# Patient Record
Sex: Female | Born: 1965 | Marital: Married | Smoking: Never smoker
Health system: Southern US, Community
[De-identification: ages and names within clinical notes are randomized; demographics above are authoritative.]

## PROBLEM LIST (undated history)

## (undated) DIAGNOSIS — E66811 Obesity, class 1: Secondary | ICD-10-CM

## (undated) DIAGNOSIS — E669 Obesity, unspecified: Secondary | ICD-10-CM

## (undated) DIAGNOSIS — F4321 Adjustment disorder with depressed mood: Secondary | ICD-10-CM

## (undated) DIAGNOSIS — Z9289 Personal history of other medical treatment: Secondary | ICD-10-CM

## (undated) HISTORY — DX: Obesity, unspecified: E66.9

## (undated) HISTORY — DX: Obesity, class 1: E66.811

## (undated) HISTORY — DX: Personal history of other medical treatment: Z92.89

## (undated) HISTORY — DX: Adjustment disorder with depressed mood: F43.21

---
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
## 2013-01-28 DIAGNOSIS — F4321 Adjustment disorder with depressed mood: Secondary | ICD-10-CM

## 2013-01-28 HISTORY — DX: Adjustment disorder with depressed mood: F43.21

## 2015-11-06 DIAGNOSIS — Z9289 Personal history of other medical treatment: Secondary | ICD-10-CM

## 2015-11-06 HISTORY — DX: Personal history of other medical treatment: Z92.89

## 2016-11-04 ENCOUNTER — Telehealth: Payer: Self-pay | Admitting: Certified Nurse Midwife

## 2016-11-04 MED ORDER — LEVONORGESTREL-ETHINYL ESTRAD 0.15-30 MG-MCG PO TABS: 1.0000 | ORAL_TABLET | Freq: Every day | ORAL | 0 refills | Status: DC

## 2016-11-04 NOTE — Telephone Encounter (Signed)
Left msg for pt that one month of rx sent in.

## 2016-11-04 NOTE — Telephone Encounter (Signed)
Patient needs refill on bc, annual scheduled 11/7 with [facility] but will run out before.  Preferred pharmacy [facility] [place].

## 2016-11-27 ENCOUNTER — Telehealth: Payer: Self-pay

## 2016-11-27 MED ORDER — LEVONORGESTREL-ETHINYL ESTRAD 0.15-30 MG-MCG PO TABS: 1.0000 | ORAL_TABLET | Freq: Every day | ORAL | 0 refills | Status: DC

## 2016-11-27 NOTE — Telephone Encounter (Signed)
Pt called for refill of bc [facility] [place] to get her to appt on 11/29th.  Pt aware via vm.

## 2016-12-04 ENCOUNTER — Ambulatory Visit: Payer: Self-pay | Admitting: Certified Nurse Midwife

## 2016-12-24 ENCOUNTER — Other Ambulatory Visit: Payer: Self-pay

## 2016-12-24 DIAGNOSIS — Z3041 Encounter for surveillance of contraceptive pills: Secondary | ICD-10-CM

## 2016-12-24 MED ORDER — LEVONORGESTREL-ETHINYL ESTRAD 0.15-30 MG-MCG PO TABS: 1.0000 | ORAL_TABLET | Freq: Every day | ORAL | 0 refills | Status: DC

## 2016-12-26 ENCOUNTER — Ambulatory Visit: Payer: Self-pay | Admitting: Certified Nurse Midwife

## 2017-01-16 ENCOUNTER — Ambulatory Visit: Payer: Self-pay | Admitting: Certified Nurse Midwife

## 2017-01-16 ENCOUNTER — Other Ambulatory Visit: Payer: Self-pay

## 2017-01-16 DIAGNOSIS — Z3041 Encounter for surveillance of contraceptive pills: Secondary | ICD-10-CM

## 2017-01-16 MED ORDER — LEVONORGESTREL-ETHINYL ESTRAD 0.15-30 MG-MCG PO TABS: 1.0000 | ORAL_TABLET | Freq: Every day | ORAL | 0 refills | Status: DC

## 2017-01-16 NOTE — Telephone Encounter (Signed)
Pt had to resch annual to 1/21, need [facility]ia ref[facility] til then[facility] [place].  [phone]  Left detailed msg the refill eRx'd.

## 2017-02-17 ENCOUNTER — Encounter: Payer: Self-pay | Admitting: Certified Nurse Midwife

## 2017-02-17 ENCOUNTER — Ambulatory Visit (INDEPENDENT_AMBULATORY_CARE_PROVIDER_SITE_OTHER): Payer: BLUE CROSS/BLUE SHIELD | Admitting: Certified Nurse Midwife

## 2017-02-17 VITALS — BP 122/82 | HR 78 | Ht 66.0 in | Wt 204.0 lb

## 2017-02-17 DIAGNOSIS — Z800 Family history of malignant neoplasm of digestive organs: Secondary | ICD-10-CM

## 2017-02-17 DIAGNOSIS — Z01419 Encounter for gynecological examination (general) (routine) without abnormal findings: Secondary | ICD-10-CM

## 2017-02-17 DIAGNOSIS — E669 Obesity, unspecified: Secondary | ICD-10-CM | POA: Diagnosis not present

## 2017-02-17 DIAGNOSIS — Z1239 Encounter for other screening for malignant neoplasm of breast: Secondary | ICD-10-CM

## 2017-02-17 DIAGNOSIS — Z131 Encounter for screening for diabetes mellitus: Secondary | ICD-10-CM

## 2017-02-17 DIAGNOSIS — Z1231 Encounter for screening mammogram for malignant neoplasm of breast: Secondary | ICD-10-CM | POA: Diagnosis not present

## 2017-02-17 DIAGNOSIS — Z124 Encounter for screening for malignant neoplasm of cervix: Secondary | ICD-10-CM | POA: Diagnosis not present

## 2017-02-17 DIAGNOSIS — R635 Abnormal weight gain: Secondary | ICD-10-CM | POA: Diagnosis not present

## 2017-02-17 DIAGNOSIS — Z1211 Encounter for screening for malignant neoplasm of colon: Secondary | ICD-10-CM

## 2017-02-17 DIAGNOSIS — E66811 Obesity, class 1: Secondary | ICD-10-CM

## 2017-02-17 DIAGNOSIS — Z13220 Encounter for screening for lipoid disorders: Secondary | ICD-10-CM | POA: Diagnosis not present

## 2017-02-17 DIAGNOSIS — Z309 Encounter for contraceptive management, unspecified: Secondary | ICD-10-CM | POA: Insufficient documentation

## 2017-02-17 DIAGNOSIS — Z3041 Encounter for surveillance of contraceptive pills: Secondary | ICD-10-CM

## 2017-02-17 MED ORDER — LEVONORGESTREL-ETHINYL ESTRAD 0.15-30 MG-MCG PO TABS: 1.0000 | ORAL_TABLET | Freq: Every day | ORAL | 11 refills | Status: DC

## 2017-02-17 NOTE — Progress Notes (Signed)
Gynecology Annual Exam  PCP: Patient, No Pcp Per  Chief Complaint:  Chief Complaint  Patient presents with  . Gynecologic Exam    History of Present Illness:Jessica Henson is a 52 year old Caucasian/White female, G0 P0000, who presents for her annual exam. She is having no significant GYN problems.  Her menses are regular and her LMP was 01/24/2017 . They occur every 28 days , they last 3-4 days , are medium flow, and are without clots.  She denies dysmenorrhea.  The patient's past medical history is detailed in the past medical history section.  Since the time of her last annual GYN exam 10/25/2015, she had gained 20# (actually 24#, but has lost 4#). She has not been exercising regularly and has been binge drinking to deal with the stressors in her life. Her goal weight is 150#.  She is sexually active. She is currently using birth control pills for contraception.  Her most recent pap smear was obtained 10/25/2015 and was negative. No history of abnormal Pap smears. Her most recent mammogram was obtained on 11/06/2015 and was negative. Past history of abnormal mammogram  showed a mass which was B9 (it was aspirated)..  There is no family history of breast cancer.  There is no family history of ovarian cancer. There is a strong family hx of lung cancer (mother, father (95) and mat aunt).  The patient does do self breast exams.  The patient does not smoke.  The patient does binge drink, but has not had a drink in the last few weeks  The patient does not use illegal drugs.  The patient used to  exercise regularly, and is trying to return to exercising 5x/week. The patient gets adequate calcium in her diet and with her supplement. She had a recent cholesterol screen in 2017 that was normal. She desires repeat testing.   Review of Systems: Review of Systems  Constitutional: Negative for chills, fever and weight loss.       Positive for weight gain.  HENT: Negative for congestion, sinus  pain and sore throat.   Eyes: Negative for blurred vision and pain.  Respiratory: Negative for hemoptysis, shortness of breath and wheezing.   Cardiovascular: Negative for chest pain, palpitations and leg swelling.  Gastrointestinal: Negative for abdominal pain, blood in stool, diarrhea, heartburn, nausea and vomiting.  Genitourinary: Negative for dysuria, frequency, hematuria and urgency.  Musculoskeletal: Negative for back pain, joint pain and myalgias.  Skin: Negative for itching and rash.  Neurological: Negative for dizziness, tingling and headaches.  Endo/Heme/Allergies: Negative for environmental allergies and polydipsia. Does not bruise/bleed easily.       Negative for hirsutism   Psychiatric/Behavioral: Negative for depression. The patient is not nervous/anxious and does not have insomnia.        Positive for mood swings/ stress    Past Medical History:  Past Medical History:  Diagnosis Date  . History of mammogram 11/06/2015   Benign BIRADS 2  . Obesity (BMI 30.0-34.9)   . Situational depression 2015    Past Surgical History:  Past Surgical History:  Procedure Laterality Date  . TONSILLECTOMY  1982    Family History:  Family History  Problem Relation Age of Onset  . Colon cancer Mother 13  . Lung cancer Mother 40  . Lung cancer Father 58  . Lung cancer Maternal Aunt     Social History:  Social History   Socioeconomic History  . Marital status: Married    Spouse  name: Not on file  . Number of children: 0  . Years of education: Not on file  . Highest education level: Not on file  Social Needs  . Financial resource strain: Not on file  . Food insecurity - worry: Not on file  . Food insecurity - inability: Not on file  . Transportation needs - medical: Not on file  . Transportation needs - non-medical: Not on file  Occupational History  . Occupation: [employer] sales  Tobacco Use  . Smoking status: Never Smoker  . Smokeless tobacco: Never Used  Substance and  Sexual Activity  . Alcohol use: Yes    Comment: varies, binge drinks  . Drug use: No  . Sexual activity: Yes    Partners: Male    Birth control/protection: Pill  Other Topics Concern  . Not on file  Social History Narrative  . Not on file    Allergies:  No Known Allergies  Medications:  Current Outpatient Medications:  .  Biotin 1 MG CAPS, Take 1 capsule by mouth daily., Disp: , Rfl:  .  Calcium 500-100 MG-UNIT CHEW, Chew 1 Piece by mouth 2 (two) times daily as needed., Disp: , Rfl:  .  levonorgestrel-ethinyl estradiol (PORTIA-28) 0.15-30 MG-MCG tablet, Take 1 tablet by mouth daily., Disp: 1 Package, Rfl: 11 .  Multiple Vitamins-Minerals (MULTIVITAMIN WITH MINERALS) tablet, Take 1 tablet by mouth daily., Disp: , Rfl:  Physical Exam Vitals: BP 122/82   Pulse 78   Ht 5\' 6"  (1.676 m)   Wt 204 lb (92.5 kg)   LMP 01/24/2017 (Approximate)   BMI 32.93 kg/m   General: WF in NAD HEENT: normocephalic, anicteric  Reddened cheeks and chin (rosecea?) Neck: no thyroid enlargement, no palpable nodules, no cervical lymphadenopathy  Pulmonary: No increased work of breathing, CTAB Cardiovascular: RRR, without murmur  Breast: Breast symmetrical, no tenderness, no palpable nodules or masses, no skin or nipple retraction present, no nipple discharge.  No axillary, infraclavicular or supraclavicular lymphadenopathy. Abdomen: Soft, non-tender, non-distended.  Umbilicus without lesions.  No hepatomegaly or masses palpable. No evidence of hernia. Genitourinary:  External: Normal external female genitalia.  Normal urethral meatus, normal Bartholin's and Skene's glands.    Vagina: Normal vaginal mucosa, no evidence of prolapse.    Cervix: Grossly normal in appearance, no bleeding, non-tender  Uterus: Anteverted and detrorotated, normal size, shape, and consistency, mobile, and non-tender  Adnexa: No adnexal masses, non-tender  Rectal: deferred  Lymphatic: no evidence of inguinal  lymphadenopathy Extremities: no edema, erythema, or tenderness Neurologic: Grossly intact Psychiatric: mood appropriate, affect full     Assessment: 52 y.o. G0P0000 normal annual gyn exam Obesity/weight gain due to excessive caloric intake  Alcohol consumption-binge drinking  Plan:   1) Breast cancer screening - recommend monthly self breast exam and annual screening mammogram.. Mammogram was ordered today. [facility]  2) Colon cancer screening-will refer for colonoscopy  3) Cervical cancer screening - Pap was done. ASCCP guidelines and rational discussed.  Patient opts for yearly screening interval  4) Contraception - Refill of Portia #1/ RF x11 sent to pharmacy  5) Routine healthcare maintenance including cholesterol and diabetes screening ordered today. Discussed weight gain and her plan to lose weight. Declines any meds to help with mood swings/ stress. She understands that she needs to stop drinking alcohol (has difficulty drinking in moderation). Has been using meditation to try to help her deal with stress-has not helped.   Farrel Connersolleen Maxim Bedel, CNM

## 2017-02-18 ENCOUNTER — Encounter: Payer: Self-pay | Admitting: Certified Nurse Midwife

## 2017-02-18 LAB — COMPREHENSIVE METABOLIC PANEL
A/G RATIO: 1.6 (ref 1.2–2.2)
ALK PHOS: 60 IU/L (ref 39–117)
ALT: 17 IU/L (ref 0–32)
AST: 17 IU/L (ref 0–40)
Albumin: 4.4 g/dL (ref 3.5–5.5)
BILIRUBIN TOTAL: 0.4 mg/dL (ref 0.0–1.2)
BUN/Creatinine Ratio: 13 (ref 9–23)
BUN: 12 mg/dL (ref 6–24)
CALCIUM: 9.1 mg/dL (ref 8.7–10.2)
CHLORIDE: 100 mmol/L (ref 96–106)
CO2: 23 mmol/L (ref 20–29)
Creatinine, Ser: 0.89 mg/dL (ref 0.57–1.00)
GFR calc non Af Amer: 76 mL/min/{1.73_m2} (ref >59)
GFR, EST AFRICAN AMERICAN: 88 mL/min/{1.73_m2} (ref >59)
Globulin, Total: 2.7 g/dL (ref 1.5–4.5)
Glucose: 105 mg/dL — ABNORMAL HIGH (ref 65–99)
Potassium: 4.2 mmol/L (ref 3.5–5.2)
SODIUM: 139 mmol/L (ref 134–144)
TOTAL PROTEIN: 7.1 g/dL (ref 6.0–8.5)

## 2017-02-18 LAB — LIPID PANEL WITH LDL/HDL RATIO
CHOLESTEROL TOTAL: 222 mg/dL — AB (ref 100–199)
HDL: 60 mg/dL (ref >39)
LDL Calculated: 139 mg/dL — ABNORMAL HIGH (ref 0–99)
LDl/HDL Ratio: 2.3 ratio (ref 0.0–3.2)
Triglycerides: 115 mg/dL (ref 0–149)
VLDL Cholesterol Cal: 23 mg/dL (ref 5–40)

## 2017-02-18 LAB — HGB A1C W/O EAG: HEMOGLOBIN A1C: 5.6 % (ref 4.8–5.6)

## 2017-02-19 LAB — IGP, APTIMA HPV
HPV APTIMA: NEGATIVE
PAP SMEAR COMMENT: 0

## 2017-02-26 ENCOUNTER — Telehealth: Payer: Self-pay

## 2017-02-26 ENCOUNTER — Encounter: Payer: Self-pay | Admitting: Certified Nurse Midwife

## 2017-02-26 NOTE — Telephone Encounter (Signed)
Pt had physical w/CLG 02/17/17 & has some questions about getting her Biometric Screen Form submitted. AV#[id number]#[phone]

## 2017-02-26 NOTE — Telephone Encounter (Signed)
Patient must log in and print form for us to fax in. We cannot access blank forms without a user name and password. Please let her know to send to us. Thank you!

## 2017-02-26 NOTE — Telephone Encounter (Signed)
Pt has BCBS and can receive HSA Funds once she has received her AE & her provider goes online & completes/submits the Biometric Screening Form. [email or web address] to get the form & fax to #[phone] when complete.

## 2017-02-27 NOTE — Telephone Encounter (Signed)
Form received via email. Pt does not currently have my chart. I have printed the form & put on your desk.

## 2017-02-27 NOTE — Telephone Encounter (Signed)
Notified pt. She will email the form to us.

## 2017-02-28 NOTE — Telephone Encounter (Signed)
Pt aware form completed on 02/27/2017 and faxed to [facility].

## 2017-03-25 ENCOUNTER — Other Ambulatory Visit: Payer: Self-pay | Admitting: Certified Nurse Midwife

## 2017-03-25 DIAGNOSIS — Z3041 Encounter for surveillance of contraceptive pills: Secondary | ICD-10-CM

## 2017-03-25 MED ORDER — LEVONORGESTREL-ETHINYL ESTRAD 0.15-30 MG-MCG PO TABS: 1.0000 | ORAL_TABLET | Freq: Every day | ORAL | 11 refills | Status: DC

## 2017-03-28 ENCOUNTER — Encounter: Payer: Self-pay | Admitting: *Deleted

## 2017-04-15 ENCOUNTER — Telehealth: Payer: Self-pay | Admitting: Gastroenterology

## 2017-04-15 NOTE — Telephone Encounter (Signed)
Patient called ready to schedule colonoscopy. 

## 2018-03-05 ENCOUNTER — Other Ambulatory Visit: Payer: Self-pay

## 2018-03-05 ENCOUNTER — Ambulatory Visit
Admission: EM | Admit: 2018-03-05 | Discharge: 2018-03-05 | Disposition: A | Discharge status: Home / Self Care | Payer: BLUE CROSS/BLUE SHIELD | Attending: Family Medicine | Admitting: Family Medicine

## 2018-03-05 DIAGNOSIS — R509 Fever, unspecified: Secondary | ICD-10-CM

## 2018-03-05 DIAGNOSIS — J111 Influenza due to unidentified influenza virus with other respiratory manifestations: Secondary | ICD-10-CM

## 2018-03-05 DIAGNOSIS — R05 Cough: Secondary | ICD-10-CM

## 2018-03-05 DIAGNOSIS — R0981 Nasal congestion: Secondary | ICD-10-CM

## 2018-03-05 DIAGNOSIS — R059 Cough, unspecified: Secondary | ICD-10-CM

## 2018-03-05 MED ORDER — OSELTAMIVIR PHOSPHATE 75 MG PO CAPS: 75.0000 mg | ORAL_CAPSULE | Freq: Two times a day (BID) | ORAL | 0 refills | Status: DC

## 2018-03-05 MED ORDER — HYDROCODONE-HOMATROPINE 5-1.5 MG/5ML PO SYRP: 5.0000 mL | ORAL_SOLUTION | Freq: Four times a day (QID) | ORAL | 0 refills | Status: DC | PRN

## 2018-03-05 NOTE — Discharge Instructions (Signed)
Rest. ° °Fluids. ° °Medications as prescribed. ° °Take care ° °Dr. Jermika Olden  °

## 2018-03-05 NOTE — ED Provider Notes (Signed)
[facility]    CSN: [id number] Arrival date & time: 03/05/18  1021  History   Chief Complaint Chief Complaint  Patient presents with  . Cough   HPI   53 year old female presents with respiratory symptoms.  Started on Monday.  She reports cough, body aches, fever, congestion.  Symptoms are severe.  Worsening.  Patient concerned that she has influenza.  Cough is most troublesome.  Worse at night.  No known relieving factors.  No other associated symptoms.  No other complaints.  PMH, Surgical Hx, Family Hx, Social History reviewed and updated as below.  Past Medical History:  Diagnosis Date  . History of mammogram 11/06/2015   Benign BIRADS 2  . Obesity (BMI 30.0-34.9)   . Situational depression 2015   Patient Active Problem List   Diagnosis Date Noted  . Obesity (BMI 30.0-34.9) 02/17/2017  . Contraceptive management 02/17/2017   Past Surgical History:  Procedure Laterality Date  . TONSILLECTOMY  1982   OB History    Gravida  0   Para  0   Term  0   Preterm  0   AB  0   Living  0     SAB  0   TAB  0   Ectopic  0   Multiple  0   Live Births  0          Home Medications    Prior to Admission medications   Medication Sig Start Date End Date Taking? Authorizing Provider  Biotin 1 MG CAPS Take 1 capsule by mouth daily.   Yes [provider]  Calcium 500-100 MG-UNIT CHEW Chew 1 Piece by mouth 2 (two) times daily as needed.   Yes [provider]  levonorgestrel-ethinyl estradiol (PORTIA-28) 0.15-30 MG-MCG tablet Take 1 tablet by mouth daily. 03/25/17  Yes [name], CNM  Multiple Vitamins-Minerals (MULTIVITAMIN WITH MINERALS) tablet Take 1 tablet by mouth daily.   Yes [provider]  HYDROcodone-homatropine (HYCODAN) 5-1.5 MG/5ML syrup Take 5 mLs by mouth every 6 (six) hours as needed. 03/05/18   Tommie Sams, DO  oseltamivir (TAMIFLU) 75 MG capsule Take 1 capsule (75 mg total) by mouth every 12 (twelve)  hours. 03/05/18   Tommie Sams, DO    Family History Family History  Problem Relation Age of Onset  . Colon cancer Mother 48  . Lung cancer Mother 48  . Lung cancer Father 66  . Lung cancer Maternal Aunt     Social History Social History   Tobacco Use  . Smoking status: Never Smoker  . Smokeless tobacco: Never Used  Substance Use Topics  . Alcohol use: Yes    Comment: varies, binge drinks  . Drug use: No     Allergies   Patient has no known allergies.   Review of Systems Review of Systems  Constitutional: Positive for fever.  HENT: Positive for congestion.   Respiratory: Positive for cough.   Musculoskeletal:       Body aches.     Physical Exam Triage Vital Signs ED Triage Vitals  Enc Vitals Group     BP 03/05/18 1038 109/81     Pulse Rate 03/05/18 1038 90     Resp 03/05/18 1038 18     Temp 03/05/18 1038 100.1 F (37.8 C)     Temp Source 03/05/18 1038 Oral     SpO2 03/05/18 1038 99 %     Weight 03/05/18 1036 165 lb (74.8 kg)  Height 03/05/18 1036 5\' 6"  (1.676 m)     Head Circumference --      Peak Flow --      Pain Score 03/05/18 1036 7     Pain Loc --      Pain Edu? --      Excl. in GC? --    Updated Vital Signs BP 109/81 (BP Location: Left Arm)   Pulse 90   Temp 100.1 F (37.8 C) (Oral)   Resp 18   Ht 5\' 6"  (1.676 m)   Wt 74.8 kg   LMP 02/24/2018   SpO2 99%   BMI 26.63 kg/m   Visual Acuity Right Eye Distance:   Left Eye Distance:   Bilateral Distance:    Right Eye Near:   Left Eye Near:    Bilateral Near:     Physical Exam Vitals signs and nursing note reviewed.  Constitutional:      Appearance: Normal appearance.  HENT:     Head: Normocephalic and atraumatic.     Right Ear: Tympanic membrane normal.     Left Ear: Tympanic membrane normal.     Mouth/Throat:     Pharynx: Oropharynx is clear. No oropharyngeal exudate.  Eyes:     General:        Right eye: No discharge.        Left eye: No discharge.      Conjunctiva/sclera: Conjunctivae normal.  Cardiovascular:     Rate and Rhythm: Normal rate and regular rhythm.  Pulmonary:     Effort: Pulmonary effort is normal.     Breath sounds: Normal breath sounds.  Neurological:     Mental Status: She is alert.  Psychiatric:        Mood and Affect: Mood normal.        Behavior: Behavior normal.    UC Treatments / Results  Labs (all labs ordered are listed, but only abnormal results are displayed) Labs Reviewed - No data to display  EKG None  Radiology No results found.  Procedures Procedures (including critical care time)  Medications Ordered in UC Medications - No data to display  Initial Impression / Assessment and Plan / UC Course  I have reviewed the triage vital signs and the nursing notes.  Pertinent labs & imaging results that were available during my care of the patient were reviewed by me and considered in my medical decision making (see chart for details).    53 year old female presents with suspected influenza.  Treating with Tamiflu after discussion with the patient.  Hycodan for cough.  Final Clinical Impressions(s) / UC Diagnoses   Final diagnoses:  Influenza  Cough     Discharge Instructions     Rest. Fluids.  Medications as prescribed.  Take care  Dr. Adriana Simas    ED Prescriptions    Medication Sig Dispense Auth. Provider   HYDROcodone-homatropine (HYCODAN) 5-1.5 MG/5ML syrup Take 5 mLs by mouth every 6 (six) hours as needed. 120 mL Ugonna Keirsey G, DO   oseltamivir (TAMIFLU) 75 MG capsule Take 1 capsule (75 mg total) by mouth every 12 (twelve) hours. 10 capsule Tommie Sams, DO     Controlled Substance Prescriptions [place] Controlled Substance Registry consulted? Not Applicable   Tommie Sams, DO 03/05/18 1222

## 2018-03-05 NOTE — ED Triage Notes (Signed)
Patient complains of cough, fever, body aches. Patient states that this started on Monday.

## 2018-03-12 NOTE — Progress Notes (Signed)
Gynecology Annual Exam  PCP: Patient, No Pcp Per  Chief Complaint:  Chief Complaint  Patient presents with  . Gynecologic Exam    History of Present Illness:Jessica Henson is a 53 year old Caucasian/White female, G0 P0000, who presents for her annual exam. She is having no significant GYN problems.  Her menses are regular and her LMP was 02/24/2018 . They occur every 28 days , they last 2-3 days , are medium flow, and are without clots.  She does have mild dysmenorrhea.  The patient's past medical history is remarkable for situational depression and obesity.  Since the time of her last annual GYN exam 02/17/2017, she has lost 38#.  She is sexually active. She is currently using birth control pills for contraception Daniel Nones(Portia).  Her most recent pap smear was obtained 02/17/2017 and was NIL/negative HRHPV. No history of abnormal Pap smears. Her most recent mammogram was obtained on 02/26/2017 and was negative. Past history of abnormal mammogram  showed a mass which was B9 (it was aspirated)..  There is no family history of breast cancer.  There is no family history of ovarian cancer. There is a strong family hx of lung cancer (mother, father 76(52) and mat aunt).  The patient does do self breast exams.  The patient does not smoke.  The patient does drink twice a month. Reports cutting down on alcohol intake since last visit. The patient does not use illegal drugs.  The patient used to  exercise regularly The patient gets adequate calcium in her diet and with her supplement. She had a recent cholesterol screen in 2019 that was borderline. She desires repeat testing.   Review of Systems: Review of Systems  Constitutional: Positive for fever and weight loss (intentional). Negative for chills.  HENT: Positive for congestion. Negative for sinus pain and sore throat.   Eyes: Negative for blurred vision and pain.  Respiratory: Negative for hemoptysis, shortness of breath and wheezing.     Cardiovascular: Negative for chest pain, palpitations and leg swelling.  Gastrointestinal: Negative for abdominal pain, blood in stool, diarrhea, heartburn, nausea and vomiting.  Genitourinary: Negative for dysuria, frequency, hematuria and urgency.  Musculoskeletal: Negative for back pain, joint pain and myalgias.  Skin: Negative for itching and rash.  Neurological: Negative for dizziness, tingling and headaches.  Endo/Heme/Allergies: Negative for environmental allergies and polydipsia. Does not bruise/bleed easily.       Negative for hirsutism   Psychiatric/Behavioral: Negative for depression. The patient is not nervous/anxious and does not have insomnia.     Past Medical History:  Past Medical History:  Diagnosis Date  . History of mammogram 11/06/2015   Benign BIRADS 2  . Obesity (BMI 30.0-34.9)   . Situational depression 2015    Past Surgical History:  Past Surgical History:  Procedure Laterality Date  . TONSILLECTOMY  1982    Family History:  Family History  Problem Relation Age of Onset  . Colon cancer Mother 974  . Lung cancer Mother 972  . Lung cancer Father [id number]  . Lung cancer Maternal Aunt     Social History:  Social History   Socioeconomic History  . Marital status: Married    Spouse name: Not on file  . Number of children: 0  . Years of education: Not on file  . Highest education level: Not on file  Occupational History  . Occupation: [employer] sales  Social Needs  . Financial resource strain: Not on file  . Food insecurity:  Worry: Not on file    Inability: Not on file  . Transportation needs:    Medical: Not on file    Non-medical: Not on file  Tobacco Use  . Smoking status: Never Smoker  . Smokeless tobacco: Never Used  Substance and Sexual Activity  . Alcohol use: Yes    Comment: twice a month  . Drug use: No  . Sexual activity: Yes    Partners: Male    Birth control/protection: Pill  Lifestyle  . Physical activity:    Days per week: 3  days    Minutes per session: 60 min  . Stress: To some extent  Relationships  . Social connections:    Talks on phone: Twice a week    Gets together: Once a week    Attends religious service: Never    Active member of club or organization: No    Attends meetings of clubs or organizations: Never    Relationship status: Married  . Intimate partner violence:    Fear of current or ex partner: No    Emotionally abused: No    Physically abused: No    Forced sexual activity: No  Other Topics Concern  . Not on file  Social History Narrative  . Not on file    Allergies:  No Known Allergies  Medications:  Current Outpatient Medications on File Prior to Visit  Medication Sig Dispense Refill  . Biotin 1 MG CAPS Take 1 capsule by mouth daily.    . Calcium 500-100 MG-UNIT CHEW Chew 1 Piece by mouth 2 (two) times daily as needed.    . Multiple Vitamins-Minerals (MULTIVITAMIN WITH MINERALS) tablet Take 1 tablet by mouth daily.     No current facility-administered medications on file prior to visit.    Osteobioflex Vitamin D3 gummy 1000 IU daily Plant based protein shake      Physical Exam Vitals: BP 100/70   Ht 5\' 6"  (1.676 m)   Wt 166 lb (75.3 kg)   LMP 02/24/2018   BMI 26.79 kg/m   General: WF in NAD HEENT: normocephalic, anicteric Neck: no thyroid enlargement, no palpable nodules, no cervical lymphadenopathy  Pulmonary: No increased work of breathing, CTAB Cardiovascular: RRR, without murmur  Breast: Breast symmetrical, no tenderness, no palpable nodules or masses, no skin or nipple retraction present, no nipple discharge.  No axillary, infraclavicular or supraclavicular lymphadenopathy. Abdomen: Soft, non-tender, non-distended.  Umbilicus without lesions.  No hepatomegaly or masses palpable. No evidence of hernia. Genitourinary:  External: Normal external female genitalia.  Normal urethral meatus, normal Bartholin's and Skene's glands.    Vagina: Normal vaginal mucosa,  no evidence of prolapse.    Cervix: Grossly normal in appearance, no bleeding, non-tender  Uterus: Anteverted and detrorotated, normal size, shape, and consistency, mobile, and non-tender  Adnexa: No adnexal masses, non-tender  Rectal: deferred  Lymphatic: no evidence of inguinal lymphadenopathy Extremities: no edema, erythema, or tenderness Neurologic: Grossly intact Psychiatric: mood appropriate, affect full     Assessment: 53 y.o. G0P0000 normal annual gyn exam   Plan:   1) Breast cancer screening - recommend monthly self breast exam and annual screening mammogram.. Mammogram was ordered today. Patient to schedule at [facility]  2) Colon cancer screening-will refer for colonoscopy  3) Cervical cancer screening - Pap was done. ASCCP guidelines and rational discussed.  Patient opts for yearly screening interval  4) Contraception - Will decrease estrogen in OCPs and RX for Lo estrin 1/20 sent to pharmacy  5) Routine healthcare  maintenance including cholesterol and diabetes screening ordered today   6) RTO 1 year and prn.  Farrel Connersolleen Henri Guedes, CNM

## 2018-03-13 ENCOUNTER — Other Ambulatory Visit (HOSPITAL_COMMUNITY)
Admission: RE | Admit: 2018-03-13 | Discharge: 2018-03-13 | Disposition: A | Discharge status: Home / Self Care | Payer: BLUE CROSS/BLUE SHIELD | Source: Ambulatory Visit | Attending: Certified Nurse Midwife | Admitting: Certified Nurse Midwife

## 2018-03-13 ENCOUNTER — Encounter: Payer: Self-pay | Admitting: Certified Nurse Midwife

## 2018-03-13 ENCOUNTER — Ambulatory Visit (INDEPENDENT_AMBULATORY_CARE_PROVIDER_SITE_OTHER): Payer: BLUE CROSS/BLUE SHIELD | Admitting: Certified Nurse Midwife

## 2018-03-13 VITALS — BP 100/70 | Ht 66.0 in | Wt 166.0 lb

## 2018-03-13 DIAGNOSIS — Z01419 Encounter for gynecological examination (general) (routine) without abnormal findings: Secondary | ICD-10-CM | POA: Diagnosis present

## 2018-03-13 DIAGNOSIS — Z1239 Encounter for other screening for malignant neoplasm of breast: Secondary | ICD-10-CM

## 2018-03-13 DIAGNOSIS — Z1211 Encounter for screening for malignant neoplasm of colon: Secondary | ICD-10-CM

## 2018-03-13 DIAGNOSIS — Z124 Encounter for screening for malignant neoplasm of cervix: Secondary | ICD-10-CM

## 2018-03-13 DIAGNOSIS — Z131 Encounter for screening for diabetes mellitus: Secondary | ICD-10-CM

## 2018-03-13 DIAGNOSIS — Z13220 Encounter for screening for lipoid disorders: Secondary | ICD-10-CM

## 2018-03-13 MED ORDER — NORETHIN ACE-ETH ESTRAD-FE 1-20 MG-MCG PO TABS: 1.0000 | ORAL_TABLET | Freq: Every day | ORAL | 3 refills | Status: DC

## 2018-03-14 LAB — LIPID PANEL WITH LDL/HDL RATIO
CHOLESTEROL TOTAL: 214 mg/dL — AB (ref 100–199)
HDL: 53 mg/dL (ref >39)
LDL Calculated: 146 mg/dL — ABNORMAL HIGH (ref 0–99)
LDl/HDL Ratio: 2.8 ratio (ref 0.0–3.2)
Triglycerides: 73 mg/dL (ref 0–149)
VLDL Cholesterol Cal: 15 mg/dL (ref 5–40)

## 2018-03-14 LAB — GLUCOSE, RANDOM: GLUCOSE: 98 mg/dL (ref 65–99)

## 2018-03-14 LAB — HEMOGLOBIN A1C
Est. average glucose Bld gHb Est-mCnc: 114 mg/dL
Hgb A1c MFr Bld: 5.6 % (ref 4.8–5.6)

## 2018-03-15 ENCOUNTER — Encounter: Payer: Self-pay | Admitting: Certified Nurse Midwife

## 2018-03-16 LAB — CYTOLOGY - PAP: Diagnosis: NEGATIVE

## 2018-03-22 ENCOUNTER — Other Ambulatory Visit: Payer: Self-pay | Admitting: Certified Nurse Midwife

## 2018-03-31 ENCOUNTER — Encounter: Payer: Self-pay | Admitting: *Deleted

## 2018-04-02 ENCOUNTER — Telehealth: Payer: Self-pay

## 2018-04-02 NOTE — Telephone Encounter (Signed)
Pt called today wanting results mailed to her from last visit. Sent results to pt

## 2018-04-08 ENCOUNTER — Telehealth: Payer: Self-pay | Admitting: Certified Nurse Midwife

## 2018-04-08 NOTE — Telephone Encounter (Signed)
Patient saw [facility] on 03/13/2018 and had a physical form that needed to be filled out and faxed over to [facility].  The form was sent but the date of service was not on there and they need this.  If we still have the form if this could be added and re-faxed.  If not, maybe letter could be sent instead?  [facility] fax [phone].

## 2018-04-08 NOTE — Telephone Encounter (Signed)
Added date of service to form and faxed back.  Confirmation received.

## 2018-07-06 ENCOUNTER — Telehealth: Payer: Self-pay

## 2018-07-06 NOTE — Telephone Encounter (Signed)
Pt has questions about bc rx.  [phone]  [facility]

## 2018-07-07 NOTE — Telephone Encounter (Signed)
Returned patient's call regarding BTB on current pill. She was switched from [place] to Loestrin 1/20 4 months ago. On the last two months she has had bleeding on the third week of pills instead of the fourth week. Discussed options. Patient wishes to see if she continues to have BTB, if so will change pills to Alesse generic. Has not missed pills.

## 2018-08-31 ENCOUNTER — Telehealth: Payer: Self-pay

## 2018-08-31 NOTE — Telephone Encounter (Signed)
Pt calling for CLG to call her in Diflucan for a yeast infection.  Jessica Henson, pt has not been seen since 2/14, CLG is out of the office. Pt need to be seen by provider. Please call her at [phone] thanks

## 2018-09-01 NOTE — Telephone Encounter (Signed)
Per Kenney Houseman, front desk, pt refused an appointment. KJ CMA

## 2019-04-06 ENCOUNTER — Encounter: Payer: Self-pay | Admitting: Certified Nurse Midwife

## 2019-04-06 ENCOUNTER — Other Ambulatory Visit: Payer: Self-pay | Admitting: Certified Nurse Midwife

## 2019-04-06 ENCOUNTER — Other Ambulatory Visit: Payer: Self-pay

## 2019-04-06 ENCOUNTER — Ambulatory Visit (INDEPENDENT_AMBULATORY_CARE_PROVIDER_SITE_OTHER): Payer: BC Managed Care – PPO | Admitting: Certified Nurse Midwife

## 2019-04-06 VITALS — BP 112/80 | HR 72 | Temp 97.6°F | Ht 67.0 in | Wt 157.0 lb

## 2019-04-06 DIAGNOSIS — Z01419 Encounter for gynecological examination (general) (routine) without abnormal findings: Secondary | ICD-10-CM

## 2019-04-06 DIAGNOSIS — Z13220 Encounter for screening for lipoid disorders: Secondary | ICD-10-CM

## 2019-04-06 DIAGNOSIS — Z1211 Encounter for screening for malignant neoplasm of colon: Secondary | ICD-10-CM

## 2019-04-06 DIAGNOSIS — Z124 Encounter for screening for malignant neoplasm of cervix: Secondary | ICD-10-CM

## 2019-04-06 DIAGNOSIS — Z131 Encounter for screening for diabetes mellitus: Secondary | ICD-10-CM

## 2019-04-06 MED ORDER — LEVONORGESTREL-ETHINYL ESTRAD 0.1-20 MG-MCG PO TABS: 1.0000 | ORAL_TABLET | Freq: Every day | ORAL | 11 refills | Status: DC

## 2019-04-06 NOTE — Progress Notes (Signed)
Gynecology Annual Exam  PCP: Patient, No Pcp Per  Chief Complaint:  Chief Complaint  Patient presents with  . Gynecologic Exam    History of Present Illness:Jessica Henson is a 54 year old Caucasian/White female, G0 P0000, who presents for her annual exam. She is having no significant GYN problems.  Her menses irregular since discontinuing her OCPs eight months ago and her LMP was 216/2021 . They occur every 28-45 days , they last 3-4 days , are medium flow, and are without clots.  She does have mild dysmenorrhea.  The patient's past medical history is remarkable for situational depression and obesity.  Since the time of her last annual GYN exam 03/13/2018, she has lost 8# and in the last 2 years has lost 46#.  She is sexually active. She is currently using condoms for birth control.  Her most recent pap smear was obtained 03/13/2018 and was NIL.Marland Kitchen No history of abnormal Pap smears. Her most recent mammogram was obtained on 03/13/2018 and was negative. Past history of abnormal mammogram  showed a mass which was B9 (it was aspirated)..  There is no family history of breast cancer.  There is no family history of ovarian cancer. There is a strong family hx of lung cancer (mother, father (42) and mat aunt).  The patient does do self breast exams.  The patient does not smoke.  The patient does drink twice a month.  The patient does not use illegal drugs.  The patient used to  exercise regularly The patient gets adequate calcium in her diet and with her supplement. She had a recent cholesterol screen in 2020 that was borderline.   Review of Systems: Review of Systems  Constitutional: Positive for weight loss (intentional). Negative for chills and fever.  HENT: Negative for congestion, sinus pain and sore throat.   Eyes: Negative for blurred vision and pain.  Respiratory: Negative for hemoptysis, shortness of breath and wheezing.   Cardiovascular: Negative for chest pain, palpitations and  leg swelling.  Gastrointestinal: Negative for abdominal pain, blood in stool, diarrhea, heartburn, nausea and vomiting.  Genitourinary: Negative for dysuria, frequency, hematuria and urgency.  Musculoskeletal: Negative for back pain, joint pain and myalgias.  Skin: Negative for itching and rash.  Neurological: Negative for dizziness, tingling and headaches.  Endo/Heme/Allergies: Negative for environmental allergies and polydipsia. Does not bruise/bleed easily.       Negative for hirsutism   Psychiatric/Behavioral: Negative for depression. The patient is not nervous/anxious and does not have insomnia.     Past Medical History:  Past Medical History:  Diagnosis Date  . History of mammogram 11/06/2015   Benign BIRADS 2  . Obesity (BMI 30.0-34.9)   . Situational depression 2015    Past Surgical History:  Past Surgical History:  Procedure Laterality Date  . TONSILLECTOMY  1982    Family History:  Family History  Problem Relation Age of Onset  . Colon cancer Mother 65  . Lung cancer Mother 92  . Lung cancer Father 7  . Lung cancer Maternal Aunt     Social History:  Social History   Socioeconomic History  . Marital status: Married    Spouse name: Not on file  . Number of children: 0  . Years of education: Not on file  . Highest education level: Not on file  Occupational History  . Occupation: [employer]  Tobacco Use  . Smoking status: Never Smoker  . Smokeless tobacco: Never Used  Substance and Sexual  Activity  . Alcohol use: Yes    Comment: twice a month  . Drug use: No  . Sexual activity: Yes    Partners: Male    Birth control/protection: None, Condom  Other Topics Concern  . Not on file  Social History Narrative  . Not on file   Social Determinants of Health   Financial Resource Strain:   . Difficulty of Paying Living Expenses: Not on file  Food Insecurity:   . Worried About Charity fundraiser in the Last Year: Not on file  . Ran Out of Food in the  Last Year: Not on file  Transportation Needs:   . Lack of Transportation (Medical): Not on file  . Lack of Transportation (Non-Medical): Not on file  Physical Activity:   . Days of Exercise per Week: Not on file  . Minutes of Exercise per Session: Not on file  Stress:   . Feeling of Stress : Not on file  Social Connections:   . Frequency of Communication with Friends and Family: Not on file  . Frequency of Social Gatherings with Friends and Family: Not on file  . Attends Religious Services: Not on file  . Active Member of Clubs or Organizations: Not on file  . Attends Archivist Meetings: Not on file  . Marital Status: Not on file  Intimate Partner Violence:   . Fear of Current or Ex-Partner: Not on file  . Emotionally Abused: Not on file  . Physically Abused: Not on file  . Sexually Abused: Not on file    Allergies:  No Known Allergies  Medications:  Current Outpatient Medications on File Prior to Visit  Medication Sig Dispense Refill  . Biotin 1 MG CAPS Take 1 capsule by mouth daily.    . Calcium 500-100 MG-UNIT CHEW Chew 1 Piece by mouth 2 (two) times daily as needed.    . Multiple Vitamins-Minerals (MULTIVITAMIN WITH MINERALS) tablet Take 1 tablet by mouth daily.     No current facility-administered medications on file prior to visit.      Physical Exam Vitals: BP 112/80   Pulse 72   Temp 97.6 F (36.4 C)   Ht 5\' 7"  (1.702 m)   Wt 157 lb (71.2 kg)   LMP 03/16/2019 (Exact Date)   BMI 24.59 kg/m   General: WF in NAD HEENT: normocephalic, anicteric Neck: no thyroid enlargement, no palpable nodules, no cervical lymphadenopathy  Pulmonary: No increased work of breathing, CTAB Cardiovascular: RRR, without murmur  Breast: Breast symmetrical, no tenderness, no palpable nodules or masses, no skin or nipple retraction present, no nipple discharge.  No axillary, infraclavicular or supraclavicular lymphadenopathy. Abdomen: Soft, non-tender, non-distended.   Umbilicus without lesions.  No hepatomegaly or masses palpable. No evidence of hernia. Genitourinary:  External: Normal external female genitalia.  Normal urethral meatus, normal Bartholin's and Skene's glands.    Vagina: Normal vaginal mucosa, no evidence of prolapse.    Cervix: Grossly normal in appearance, no bleeding, non-tender  Uterus: Anteverted and detrorotated, normal size, shape, and consistency, mobile, and non-tender  Adnexa: No adnexal masses, non-tender  Rectal: deferred  Lymphatic: no evidence of inguinal lymphadenopathy Extremities: no edema, erythema, or tenderness Neurologic: Grossly intact Psychiatric: mood appropriate, affect full     Assessment: 54 y.o. G0P0000 normal annual gyn exam   Plan:   1) Breast cancer screening - recommend monthly self breast exam and annual screening mammogram.. Mammogram was ordered today. Patient to schedule at [place]  2) Colon cancer  screening-desires to do Cologuard  3) Cervical cancer screening - Pap was done. ([facility]DL labs) ASCCP guidelines and rational discussed.  Patient opts for yearly screening interval  4) Contraception - Alesse #1 RF x 11  5) Routine healthcare maintenance including cholesterol and diabetes screening ordered today   6) RTO 1 year and prn.  [name], CNM

## 2019-04-07 LAB — LIPID PANEL W/O CHOL/HDL RATIO
Cholesterol, Total: 171 mg/dL (ref 100–199)
HDL: 73 mg/dL (ref >39)
LDL Chol Calc (NIH): 87 mg/dL (ref 0–99)
Triglycerides: 52 mg/dL (ref 0–149)
VLDL Cholesterol Cal: 11 mg/dL (ref 5–40)

## 2019-04-07 LAB — GLUCOSE, RANDOM: Glucose: 92 mg/dL (ref 65–99)

## 2019-04-09 ENCOUNTER — Telehealth: Payer: Self-pay

## 2019-04-09 NOTE — Telephone Encounter (Signed)
Pt calling stating she saw CLG earlier this week. She had lab work and gave CLG a form from [employer] that needs to results of labs. Pt saw labs were ready in MyChart and wanting to remind CLG or her nurse to transfer the results and fax to [facility]. CB# [phone]

## 2019-04-09 NOTE — Telephone Encounter (Signed)
Giving these back to you to fax to [facility]

## 2019-04-09 NOTE — Telephone Encounter (Signed)
Left detailed msg thanking pt for the reminder and form has been faxed to [facility].

## 2019-04-11 ENCOUNTER — Encounter: Payer: Self-pay | Admitting: Certified Nurse Midwife

## 2019-04-20 ENCOUNTER — Telehealth: Payer: Self-pay

## 2019-04-20 NOTE — Telephone Encounter (Signed)
CAlled patient and left message that Pap smear was normal. [name], CNM Did not call her previously.

## 2019-04-20 NOTE — Telephone Encounter (Signed)
Pt calling triage stating she is returning someones call. Did you or CG call her?

## 2019-06-07 LAB — COLOGUARD

## 2019-06-10 LAB — COLOGUARD: COLOGUARD: NEGATIVE

## 2019-06-16 ENCOUNTER — Telehealth: Payer: Self-pay

## 2019-06-16 NOTE — Telephone Encounter (Signed)
Patient aware of negative Cologaurd results.

## 2020-02-23 ENCOUNTER — Other Ambulatory Visit: Payer: Self-pay | Admitting: Advanced Practice Midwife

## 2020-02-23 ENCOUNTER — Telehealth: Payer: Self-pay | Admitting: Obstetrics and Gynecology

## 2020-02-23 DIAGNOSIS — Z0000 Encounter for general adult medical examination without abnormal findings: Secondary | ICD-10-CM

## 2020-02-23 DIAGNOSIS — Z131 Encounter for screening for diabetes mellitus: Secondary | ICD-10-CM

## 2020-02-23 DIAGNOSIS — Z130 Encounter for screening for diseases of the blood and blood-forming organs and certain disorders involving the immune mechanism: Secondary | ICD-10-CM

## 2020-02-23 DIAGNOSIS — Z13220 Encounter for screening for lipoid disorders: Secondary | ICD-10-CM

## 2020-02-23 DIAGNOSIS — Z1239 Encounter for other screening for malignant neoplasm of breast: Secondary | ICD-10-CM

## 2020-02-23 NOTE — Progress Notes (Signed)
Future orders placed for mammogram, labs to be done at annual exam 04/13/20

## 2020-02-23 NOTE — Telephone Encounter (Signed)
Pt has AE 04/13/20 with [facility]. Needs mammo and lab orders so can have done same day. Also needs 1 mo OCP RF to get her to annual.

## 2020-02-24 ENCOUNTER — Other Ambulatory Visit: Payer: Self-pay | Admitting: Advanced Practice Midwife

## 2020-02-24 DIAGNOSIS — Z1231 Encounter for screening mammogram for malignant neoplasm of breast: Secondary | ICD-10-CM

## 2020-03-07 ENCOUNTER — Other Ambulatory Visit: Payer: Self-pay

## 2020-03-07 MED ORDER — LEVONORGESTREL-ETHINYL ESTRAD 0.1-20 MG-MCG PO TABS: 1.0000 | ORAL_TABLET | Freq: Every day | ORAL | 1 refills | Status: DC

## 2020-03-07 NOTE — Telephone Encounter (Signed)
Refill request recv'd from [facility] for Larissia 28d.  Pt has appt sched for 04/13/20.  Refills eRx'd.

## 2020-03-08 ENCOUNTER — Telehealth: Payer: Self-pay

## 2020-03-08 NOTE — Telephone Encounter (Signed)
Pt calling; has scheduled appt; will need bc refill to get her to appt; pharm states they haven't heard back from [place].  [phone]  Called pharm to be sure they recv'd the eRx; they stated they did and will get it ready for her.  Pt aware.

## 2020-04-13 ENCOUNTER — Inpatient Hospital Stay
Admission: RE | Admit: 2020-04-13 | Discharge: 2020-04-13 | Disposition: A | Discharge status: Home / Self Care | Payer: Self-pay | Source: Ambulatory Visit | Attending: *Deleted | Admitting: *Deleted

## 2020-04-13 ENCOUNTER — Other Ambulatory Visit: Payer: Self-pay | Admitting: Advanced Practice Midwife

## 2020-04-13 ENCOUNTER — Other Ambulatory Visit: Payer: Self-pay

## 2020-04-13 ENCOUNTER — Ambulatory Visit (INDEPENDENT_AMBULATORY_CARE_PROVIDER_SITE_OTHER): Payer: BC Managed Care – PPO | Admitting: Advanced Practice Midwife

## 2020-04-13 ENCOUNTER — Other Ambulatory Visit: Payer: Self-pay | Admitting: *Deleted

## 2020-04-13 ENCOUNTER — Ambulatory Visit
Admission: RE | Admit: 2020-04-13 | Discharge: 2020-04-13 | Disposition: A | Discharge status: Home / Self Care | Payer: BC Managed Care – PPO | Source: Ambulatory Visit | Attending: Advanced Practice Midwife | Admitting: Advanced Practice Midwife

## 2020-04-13 ENCOUNTER — Encounter: Payer: Self-pay | Admitting: Advanced Practice Midwife

## 2020-04-13 VITALS — BP 127/70 | Ht 66.0 in | Wt 152.0 lb

## 2020-04-13 DIAGNOSIS — Z3041 Encounter for surveillance of contraceptive pills: Secondary | ICD-10-CM | POA: Diagnosis not present

## 2020-04-13 DIAGNOSIS — Z1231 Encounter for screening mammogram for malignant neoplasm of breast: Secondary | ICD-10-CM | POA: Diagnosis present

## 2020-04-13 DIAGNOSIS — Z0000 Encounter for general adult medical examination without abnormal findings: Secondary | ICD-10-CM

## 2020-04-13 MED ORDER — LEVONORGESTREL-ETHINYL ESTRAD 0.1-20 MG-MCG PO TABS: 1.0000 | ORAL_TABLET | Freq: Every day | ORAL | 3 refills | Status: DC

## 2020-04-13 NOTE — Progress Notes (Signed)
Gynecology Annual Exam  PCP: Jessica Henson, No Pcp Per  Chief Complaint:  Chief Complaint  Jessica Henson presents with  . Annual Exam    History of Present Illness:Jessica Henson is a 55 y.o. G0P0000 presents for annual exam. The Jessica Henson has no complaints today. She had mammogram this morning.  LMP: Jessica Henson's last menstrual period was 03/23/2020. Average Interval: regular, 28 days Duration of flow: 3-4 days Heavy Menses: no Clots: no Intermenstrual Bleeding: no Postcoital Bleeding: no Dysmenorrhea: no  The Jessica Henson is sexually active. She denies dyspareunia.  The Jessica Henson does perform self breast exams.  There is no notable family history of breast or ovarian cancer in her family.  The Jessica Henson wears seatbelts: yes.   The Jessica Henson has regular exercise: she works out at [employer] and hikes regularly, she admits healthy lifestyle; diet, hydration, sleep.    The Jessica Henson denies current symptoms of depression.     Review of Systems: Review of Systems  Constitutional: Negative for chills and fever.  HENT: Negative for congestion, ear discharge, ear pain, hearing loss, sinus pain and sore throat.   Eyes: Negative for blurred vision and double vision.  Respiratory: Negative for cough, shortness of breath and wheezing.   Cardiovascular: Negative for chest pain, palpitations and leg swelling.  Gastrointestinal: Negative for abdominal pain, blood in stool, constipation, diarrhea, heartburn, melena, nausea and vomiting.  Genitourinary: Negative for dysuria, flank pain, frequency, hematuria and urgency.  Musculoskeletal: Negative for back pain, joint pain and myalgias.  Skin: Negative for itching and rash.  Neurological: Negative for dizziness, tingling, tremors, sensory change, speech change, focal weakness, seizures, loss of consciousness, weakness and headaches.  Endo/Heme/Allergies: Negative for environmental allergies. Does not bruise/bleed easily.  Psychiatric/Behavioral: Negative for depression,  hallucinations, memory loss, substance abuse and suicidal ideas. The Jessica Henson is not nervous/anxious and does not have insomnia.     Past Medical History:  Jessica Henson Active Problem List   Diagnosis Date Noted  . Contraceptive management 02/17/2017    Past Surgical History:  Past Surgical History:  Procedure Laterality Date  . TONSILLECTOMY  1982    Gynecologic History:  Jessica Henson's last menstrual period was 03/23/2020. Last Pap: 1 year ago Results were:  no abnormalities  Last mammogram: 1 year ago/and earlier this morning Results were: BI-RAD I 1 year ago. Today's result pending.  Obstetric History: G0P0000  Family History:  Family History  Problem Relation Age of Onset  . Colon cancer Mother 49  . Lung cancer Mother 99  . Lung cancer Father 85  . Lung cancer Maternal Aunt   . Breast cancer Neg Hx     Social History:  Social History   Socioeconomic History  . Marital status: Married    Spouse name: Not on file  . Number of children: 0  . Years of education: Not on file  . Highest education level: Not on file  Occupational History  . Occupation: [employer]  Tobacco Use  . Smoking status: Never Smoker  . Smokeless tobacco: Never Used  Vaping Use  . Vaping Use: Never used  Substance and Sexual Activity  . Alcohol use: Yes    Comment: twice a month  . Drug use: No  . Sexual activity: Yes    Partners: Male    Birth control/protection: None, Condom  Other Topics Concern  . Not on file  Social History Narrative  . Not on file   Social Determinants of Health   Financial Resource Strain: Not on file  Food Insecurity:  Not on file  Transportation Needs: Not on file  Physical Activity: Not on file  Stress: Not on file  Social Connections: Not on file  Intimate Partner Violence: Not on file    Allergies:  No Known Allergies  Medications: Prior to Admission medications   Medication Sig Start Date End Date Taking? Authorizing Provider  Biotin 1 MG CAPS Take 1  capsule by mouth daily.   Yes [provider]  Calcium 500-100 MG-UNIT CHEW Chew 1 Piece by mouth 2 (two) times daily as needed.   Yes [provider]  Multiple Vitamins-Minerals (MULTIVITAMIN WITH MINERALS) tablet Take 1 tablet by mouth daily.   Yes [provider]  levonorgestrel-ethinyl estradiol (ALESSE) 0.1-20 MG-MCG tablet Take 1 tablet by mouth daily. 04/13/20   Tresea Mall, CNM    Physical Exam Vitals: Blood pressure 127/70, height 5\' 6"  (1.676 m), weight 152 lb (68.9 kg), last menstrual period 03/23/2020.  General: NAD HEENT: normocephalic, anicteric Thyroid: no enlargement, no palpable nodules Pulmonary: No increased work of breathing, CTAB Cardiovascular: RRR, distal pulses 2+ Breast: Breast symmetrical, no tenderness, no palpable nodules or masses, no skin or nipple retraction present, no nipple discharge.  No axillary or supraclavicular lymphadenopathy. Abdomen: NABS, soft, non-tender, non-distended.  Umbilicus without lesions.  No hepatomegaly, splenomegaly or masses palpable. No evidence of hernia  Genitourinary: deferred for no concerns/PAP interval Extremities: no edema, erythema, or tenderness Neurologic: Grossly intact Psychiatric: mood appropriate, affect full    Assessment: 55 y.o. G0P0000 routine annual exam  Plan: Problem List Items Addressed This Visit      Other   Contraceptive management   Relevant Medications   levonorgestrel-ethinyl estradiol (ALESSE) 0.1-20 MG-MCG tablet    Other Visit Diagnoses    Well woman exam without gynecological exam    -  Primary   Relevant Medications   levonorgestrel-ethinyl estradiol (ALESSE) 0.1-20 MG-MCG tablet      1) Mammogram - recommend yearly screening mammogram.  Mammogram Is up to date  2) STI screening  was offered and declined  3) ASCCP guidelines and rationale discussed.  Jessica Henson opts for every 3 years screening interval  4) Osteoporosis  - per USPTF routine screening DEXA  at age 71  Consider FDA-approved medical therapies in postmenopausal women and men aged 62 years and older, based on the following: a) A hip or vertebral (clinical or morphometric) fracture b) T-score ? -2.5 at the femoral neck or spine after appropriate evaluation to exclude secondary causes C) Low bone mass (T-score between -1.0 and -2.5 at the femoral neck or spine) and a 10-year probability of a hip fracture ? 3% or a 10-year probability of a major osteoporosis-related fracture ? 20% based on the [place]-adapted WHO algorithm   5) Routine healthcare maintenance including cholesterol, diabetes screening discussed Ordered today  6) Colonoscopy up to date; cologuard done last year.  Screening recommended starting at age 12 for average risk individuals, age 19 for individuals deemed at increased risk (including African Americans) and recommended to continue until age 74.  For Jessica Henson age 8-85 individualized approach is recommended.  Gold standard screening is via colonoscopy, Cologuard screening is an acceptable alternative for Jessica Henson unwilling or unable to undergo colonoscopy.  "Colorectal cancer screening for average?risk adults: 2018 guideline update from the American Cancer Society"CA: A Cancer Journal for Clinicians: Jun 26, 2016   7) Return in about 1 year (around 04/13/2021) for annual established gyn.    04/15/2021, CNM [facility] 04/13/20,  9:12 AM

## 2020-04-14 LAB — CBC
Hematocrit: 43.2 % (ref 34.0–46.6)
Hemoglobin: 14.6 g/dL (ref 11.1–15.9)
MCH: 31.8 pg (ref 26.6–33.0)
MCHC: 33.8 g/dL (ref 31.5–35.7)
MCV: 94 fL (ref 79–97)
Platelets: 258 10*3/uL (ref 150–450)
RBC: 4.59 x10E6/uL (ref 3.77–5.28)
RDW: 13.1 % (ref 11.7–15.4)
WBC: 5.7 10*3/uL (ref 3.4–10.8)

## 2020-04-14 LAB — HGB A1C W/O EAG: Hgb A1c MFr Bld: 5.4 % (ref 4.8–5.6)

## 2020-04-14 LAB — LIPID PANEL WITH LDL/HDL RATIO
Cholesterol, Total: 205 mg/dL — ABNORMAL HIGH (ref 100–199)
HDL: 74 mg/dL (ref >39)
LDL Chol Calc (NIH): 118 mg/dL — ABNORMAL HIGH (ref 0–99)
LDL/HDL Ratio: 1.6 ratio (ref 0.0–3.2)
Triglycerides: 74 mg/dL (ref 0–149)
VLDL Cholesterol Cal: 13 mg/dL (ref 5–40)

## 2020-06-01 ENCOUNTER — Telehealth: Payer: Self-pay

## 2020-06-01 NOTE — Telephone Encounter (Signed)
Does the pt have the forms?

## 2020-06-01 NOTE — Telephone Encounter (Signed)
Pt calling; was seen in March in [place] for Annual; one field on the [employer] form wasn't filled out; needs to be filled out and sent to [facility].  [phone]

## 2020-06-01 NOTE — Telephone Encounter (Signed)
Tried to call pt to see which part was not filled out. I filled out all I was able to. Please let me know when pt calls back or find out which part needs to be filled out.

## 2020-06-02 NOTE — Telephone Encounter (Signed)
Can you please find out which part she is talking about that is not completed? I tried to call her yesterday but couldn't get her and I filled out all I was able to fill out.

## 2020-06-02 NOTE — Telephone Encounter (Signed)
Pt calling about Bi[employer] screening form; had annual in March 2022.  [phone]

## 2020-06-02 NOTE — Telephone Encounter (Signed)
Form corrected and refaxed

## 2020-07-05 ENCOUNTER — Telehealth: Payer: Self-pay

## 2020-07-05 NOTE — Telephone Encounter (Signed)
Pt calling about form sent to [facility] regarding her recent physical.  [phone]  Pt states they are saying the glucose wasn't done.  Amended form to show the value on it is Hgb A1c.  Glucose was not done.  Form amended and put in mail at pt's request.

## 2020-09-28 ENCOUNTER — Telehealth: Payer: Self-pay

## 2020-09-28 NOTE — Telephone Encounter (Signed)
Pt states she starts her period the week before she is supposed to; this has gone on the past several months; she has spotting to light bleeding for 10d out of the month; no missed or late pills; is this normal and okay?  [phone]

## 2020-09-28 NOTE — Telephone Encounter (Signed)
Pt calling; has question re current bc.  [phone]  Left msg to call and leave question on nurse line.

## 2020-09-29 NOTE — Telephone Encounter (Signed)
Spoke with patient and answered her questions regarding irregularities with her cycles. She will probably want reproductive hormone testing at her next annual exam. She continues to take OCP.

## 2021-03-19 ENCOUNTER — Other Ambulatory Visit: Payer: Self-pay | Admitting: Advanced Practice Midwife

## 2021-03-19 DIAGNOSIS — Z1239 Encounter for other screening for malignant neoplasm of breast: Secondary | ICD-10-CM

## 2021-03-19 NOTE — Progress Notes (Signed)
Mammogram ordered for same day as annual- 3/29- per patient request

## 2021-03-26 ENCOUNTER — Other Ambulatory Visit: Payer: Self-pay | Admitting: Advanced Practice Midwife

## 2021-03-26 DIAGNOSIS — Z0000 Encounter for general adult medical examination without abnormal findings: Secondary | ICD-10-CM

## 2021-03-26 DIAGNOSIS — Z3041 Encounter for surveillance of contraceptive pills: Secondary | ICD-10-CM

## 2021-04-24 ENCOUNTER — Other Ambulatory Visit: Payer: Self-pay | Admitting: Advanced Practice Midwife

## 2021-04-24 DIAGNOSIS — Z0000 Encounter for general adult medical examination without abnormal findings: Secondary | ICD-10-CM

## 2021-04-24 DIAGNOSIS — Z3041 Encounter for surveillance of contraceptive pills: Secondary | ICD-10-CM

## 2021-04-25 ENCOUNTER — Ambulatory Visit
Admission: RE | Admit: 2021-04-25 | Discharge: 2021-04-25 | Disposition: A | Discharge status: Home / Self Care | Payer: BC Managed Care – PPO | Source: Ambulatory Visit | Attending: Advanced Practice Midwife | Admitting: Advanced Practice Midwife

## 2021-04-25 ENCOUNTER — Encounter: Payer: Self-pay | Admitting: Advanced Practice Midwife

## 2021-04-25 ENCOUNTER — Other Ambulatory Visit: Payer: Self-pay

## 2021-04-25 ENCOUNTER — Ambulatory Visit (INDEPENDENT_AMBULATORY_CARE_PROVIDER_SITE_OTHER): Payer: BC Managed Care – PPO | Admitting: Advanced Practice Midwife

## 2021-04-25 VITALS — BP 108/72 | HR 67 | Ht 66.0 in | Wt 149.2 lb

## 2021-04-25 DIAGNOSIS — Z3041 Encounter for surveillance of contraceptive pills: Secondary | ICD-10-CM

## 2021-04-25 DIAGNOSIS — Z1239 Encounter for other screening for malignant neoplasm of breast: Secondary | ICD-10-CM

## 2021-04-25 DIAGNOSIS — Z0000 Encounter for general adult medical examination without abnormal findings: Secondary | ICD-10-CM

## 2021-04-25 DIAGNOSIS — Z1231 Encounter for screening mammogram for malignant neoplasm of breast: Secondary | ICD-10-CM | POA: Diagnosis not present

## 2021-04-25 DIAGNOSIS — Z13220 Encounter for screening for lipoid disorders: Secondary | ICD-10-CM

## 2021-04-25 DIAGNOSIS — Z131 Encounter for screening for diabetes mellitus: Secondary | ICD-10-CM | POA: Diagnosis not present

## 2021-04-25 MED ORDER — LEVONORGESTREL-ETHINYL ESTRAD 0.1-20 MG-MCG PO TABS: 1.0000 | ORAL_TABLET | Freq: Every day | ORAL | 3 refills | Status: DC

## 2021-04-25 NOTE — Patient Instructions (Signed)
Health Maintenance for Postmenopausal Women ?Menopause is a normal process in which your ability to get pregnant comes to an end. This process happens slowly over many months or years, usually between the ages of 48 and 55. Menopause is complete when you have missed your menstrual period for 12 months. ?It is important to talk with your health care provider about some of the most common conditions that affect women after menopause (postmenopausal women). These include heart disease, cancer, and bone loss (osteoporosis). Adopting a healthy lifestyle and getting preventive care can help to promote your health and wellness. The actions you take can also lower your chances of developing some of these common conditions. ?What are the signs and symptoms of menopause? ?During menopause, you may have the following symptoms: ?Hot flashes. These can be moderate or severe. ?Night sweats. ?Decrease in sex drive. ?Mood swings. ?Headaches. ?Tiredness (fatigue). ?Irritability. ?Memory problems. ?Problems falling asleep or staying asleep. ?Talk with your health care provider about treatment options for your symptoms. ?Do I need hormone replacement therapy? ?Hormone replacement therapy is effective in treating symptoms that are caused by menopause, such as hot flashes and night sweats. ?Hormone replacement carries certain risks, especially as you become older. If you are thinking about using estrogen or estrogen with progestin, discuss the benefits and risks with your health care provider. ?How can I reduce my risk for heart disease and stroke? ?The risk of heart disease, heart attack, and stroke increases as you age. One of the causes may be a change in the body's hormones during menopause. This can affect how your body uses dietary fats, triglycerides, and cholesterol. Heart attack and stroke are medical emergencies. There are many things that you can do to help prevent heart disease and stroke. ?Watch your blood pressure ?High  blood pressure causes heart disease and increases the risk of stroke. This is more likely to develop in people who have high blood pressure readings or are overweight. ?Have your blood pressure checked: ?Every 3-5 years if you are 18-39 years of age. ?Every year if you are 40 years old or older. ?Eat a healthy diet ? ?Eat a diet that includes plenty of vegetables, fruits, low-fat dairy products, and lean protein. ?Do not eat a lot of foods that are high in solid fats, added sugars, or sodium. ?Get regular exercise ?Get regular exercise. This is one of the most important things you can do for your health. Most adults should: ?Try to exercise for at least 150 minutes each week. The exercise should increase your heart rate and make you sweat (moderate-intensity exercise). ?Try to do strengthening exercises at least twice each week. Do these in addition to the moderate-intensity exercise. ?Spend less time sitting. Even light physical activity can be beneficial. ?Other tips ?Work with your health care provider to achieve or maintain a healthy weight. ?Do not use any products that contain nicotine or tobacco. These products include cigarettes, chewing tobacco, and vaping devices, such as e-cigarettes. If you need help quitting, ask your health care provider. ?Know your numbers. Ask your health care provider to check your cholesterol and your blood sugar (glucose). Continue to have your blood tested as directed by your health care provider. ?Do I need screening for cancer? ?Depending on your health history and family history, you may need to have cancer screenings at different stages of your life. This may include screening for: ?Breast cancer. ?Cervical cancer. ?Lung cancer. ?Colorectal cancer. ?What is my risk for osteoporosis? ?After menopause, you may be   at increased risk for osteoporosis. Osteoporosis is a condition in which bone destruction happens more quickly than new bone creation. To help prevent osteoporosis or  the bone fractures that can happen because of osteoporosis, you may take the following actions: ?If you are 19-50 years old, get at least 1,000 mg of calcium and at least 600 international units (IU) of vitamin D per day. ?If you are older than age 50 but younger than age 70, get at least 1,200 mg of calcium and at least 600 international units (IU) of vitamin D per day. ?If you are older than age 70, get at least 1,200 mg of calcium and at least 800 international units (IU) of vitamin D per day. ?Smoking and drinking excessive alcohol increase the risk of osteoporosis. Eat foods that are rich in calcium and vitamin D, and do weight-bearing exercises several times each week as directed by your health care provider. ?How does menopause affect my mental health? ?Depression may occur at any age, but it is more common as you become older. Common symptoms of depression include: ?Feeling depressed. ?Changes in sleep patterns. ?Changes in appetite or eating patterns. ?Feeling an overall lack of motivation or enjoyment of activities that you previously enjoyed. ?Frequent crying spells. ?Talk with your health care provider if you think that you are experiencing any of these symptoms. ?General instructions ?See your health care provider for regular wellness exams and vaccines. This may include: ?Scheduling regular health, dental, and eye exams. ?Getting and maintaining your vaccines. These include: ?Influenza vaccine. Get this vaccine each year before the flu season begins. ?Pneumonia vaccine. ?Shingles vaccine. ?Tetanus, diphtheria, and pertussis (Tdap) booster vaccine. ?Your health care provider may also recommend other immunizations. ?Tell your health care provider if you have ever been abused or do not feel safe at home. ?Summary ?Menopause is a normal process in which your ability to get pregnant comes to an end. ?This condition causes hot flashes, night sweats, decreased interest in sex, mood swings, headaches, or lack  of sleep. ?Treatment for this condition may include hormone replacement therapy. ?Take actions to keep yourself healthy, including exercising regularly, eating a healthy diet, watching your weight, and checking your blood pressure and blood sugar levels. ?Get screened for cancer and depression. Make sure that you are up to date with all your vaccines. ?This information is not intended to replace advice given to you by your health care provider. Make sure you discuss any questions you have with your health care provider. ?Document Revised: 06/05/2020 Document Reviewed: 06/05/2020 ?Elsevier Patient Education ? 2022 Elsevier Inc. ? ?

## 2021-04-25 NOTE — Progress Notes (Signed)
? ? ? ?Gynecology Annual Exam  ?PCP: Patient, No Pcp Per (Inactive) ? ?Chief Complaint:  ?Chief Complaint  ?Patient presents with  ? Annual Exam  ?  Some irregularity of periods  ? ? ?History of Present Illness:Patient is a 56 y.o. G0P0000 presents for annual exam. The patient has no complaints today. She prefers to continue taking birth control pills for now. We discussed normal variations of perimenopausal time. Her mother had periods until late 7450s. ? ?LMP: Patient's last menstrual period was 04/16/2021 (exact date). ? ?Average Interval: every 3-4 weeks ?Duration of flow:  2-3  days ?Heavy Menses: no ?Clots: no ?Intermenstrual Bleeding: no ?Postcoital Bleeding: no ?Dysmenorrhea: no ? ?The patient is sexually active. She denies dyspareunia.  The patient does perform self breast exams.  There is no notable family history of breast or ovarian cancer in her family. ? ?The patient wears seatbelts: yes.   The patient has regular exercise:  she lifts weights and does cardio activity 4-5 days per week, she admits healthy lifestyle; diet, hydration and sleep .   ? ?The patient denies current symptoms of depression.    ? ?Review of Systems: Review of Systems  ?Constitutional:  Negative for chills and fever.  ?HENT:  Negative for congestion, ear discharge, ear pain, hearing loss, sinus pain and sore throat.   ?Eyes:  Negative for blurred vision and double vision.  ?Respiratory:  Negative for cough, shortness of breath and wheezing.   ?Cardiovascular:  Negative for chest pain, palpitations and leg swelling.  ?Gastrointestinal:  Negative for abdominal pain, blood in stool, constipation, diarrhea, heartburn, melena, nausea and vomiting.  ?Genitourinary:  Negative for dysuria, flank pain, frequency, hematuria and urgency.  ?Musculoskeletal:  Negative for back pain, joint pain and myalgias.  ?Skin:  Negative for itching and rash.  ?Neurological:  Negative for dizziness, tingling, tremors, sensory change, speech change, focal  weakness, seizures, loss of consciousness, weakness and headaches.  ?Endo/Heme/Allergies:  Negative for environmental allergies. Does not bruise/bleed easily.  ?Psychiatric/Behavioral:  Negative for depression, hallucinations, memory loss, substance abuse and suicidal ideas. The patient is not nervous/anxious and does not have insomnia.   ? ?Past Medical History:  ?Patient Active Problem List  ? Diagnosis Date Noted  ? Contraceptive management 02/17/2017  ? ? ?Past Surgical History:  ?Past Surgical History:  ?Procedure Laterality Date  ? TONSILLECTOMY  1982  ? ? ?Gynecologic History:  ?Patient's last menstrual period was 04/16/2021 (exact date). ?Last Pap: 2 years ago Results were:  no abnormalities  ?Last mammogram: 1 year ago Results were: BI-RAD I ? ?Obstetric History: G0P0000 ? ?Family History:  ?Family History  ?Problem Relation Age of Onset  ? Colon cancer Mother [id number]  ? Lung cancer Mother 7672  ? Lung cancer Father 452  ? Lung cancer Maternal Aunt   ? Breast cancer Neg Hx   ? ? ?Social History:  ?Social History  ? ?Socioeconomic History  ? Marital status: Married  ?  Spouse name: Not on file  ? Number of children: 0  ? Years of education: Not on file  ? Highest education level: Not on file  ?Occupational History  ? Occupation: [employer]  ?Tobacco Use  ? Smoking status: Never  ? Smokeless tobacco: Never  ?Vaping Use  ? Vaping Use: Never used  ?Substance and Sexual Activity  ? Alcohol use: Yes  ?  Comment: twice a month  ? Drug use: No  ? Sexual activity: Yes  ?  Partners: Male  ?  Birth control/protection: Pill  ?Other Topics Concern  ? Not on file  ?Social History Narrative  ? Not on file  ? ?Social Determinants of Health  ? ?Financial Resource Strain: Not on file  ?Food Insecurity: Not on file  ?Transportation Needs: Not on file  ?Physical Activity: Not on file  ?Stress: Not on file  ?Social Connections: Not on file  ?Intimate Partner Violence: Not on file  ? ? ?Allergies:  ?No Known  Allergies ? ?Medications: ?Prior to Admission medications   ?Medication Sig Start Date End Date Taking? Authorizing Provider  ?Biotin 1 MG CAPS Take 1 capsule by mouth daily.   Yes [provider]  ?Calcium 500-100 MG-UNIT CHEW Chew 1 Piece by mouth 2 (two) times daily as needed.   Yes [provider]  ?Multiple Vitamins-Minerals (MULTIVITAMIN WITH MINERALS) tablet Take 1 tablet by mouth daily.   Yes [provider]  ?levonorgestrel-ethinyl estradiol (VIENVA) 0.1-20 MG-MCG tablet Take 1 tablet by mouth daily. 04/25/21   Tresea Mall, CNM  ? ? ?Physical Exam ?Vitals: Blood pressure 108/72, pulse 67, height 5\' 6"  (1.676 m), weight 149 lb 3.2 oz (67.7 kg), last menstrual period 04/16/2021. ? ?General: NAD ?HEENT: normocephalic, anicteric ?Thyroid: no enlargement, no palpable nodules ?Pulmonary: No increased work of breathing, CTAB ?Cardiovascular: RRR, distal pulses 2+ ?Breast: Breast symmetrical, no tenderness, no palpable nodules or masses, no skin or nipple retraction present, no nipple discharge.  No axillary or supraclavicular lymphadenopathy. ?Abdomen: NABS, soft, non-tender, non-distended.  Umbilicus without lesions.  No hepatomegaly, splenomegaly or masses palpable. No evidence of hernia  ?Genitourinary: deferred for no concerns/PAP interval ?Extremities: no edema, erythema, or tenderness ?Neurologic: Grossly intact ?Psychiatric: mood appropriate, affect full ? ?  ?Assessment: 56 y.o. G0P0000 routine annual exam ? ?Plan: ?Problem List Items Addressed This Visit   ? ?  ? Other  ? Contraceptive management  ? Relevant Medications  ? levonorgestrel-ethinyl estradiol (VIENVA) 0.1-20 MG-MCG tablet  ? ?Other Visit Diagnoses   ? ? Well woman exam without gynecological exam    -  Primary  ? Relevant Medications  ? levonorgestrel-ethinyl estradiol (VIENVA) 0.1-20 MG-MCG tablet  ? Other Relevant Orders  ? Comprehensive metabolic panel  ? Lipid Panel With LDL/HDL Ratio  ? Screening for diabetes  mellitus      ? Relevant Orders  ? Comprehensive metabolic panel  ? Screening for lipid disorders      ? Relevant Orders  ? Lipid Panel With LDL/HDL Ratio  ? ?  ? ? ?1) Mammogram - recommend yearly screening mammogram.  Mammogram  is later today. ? ?2) STI screening  was offered and declined ? ?3) ASCCP guidelines and rationale discussed.  Patient opts for every 3 years screening interval ? ?4) Osteoporosis  ?- per USPTF routine screening DEXA at age 28 ? ?Consider FDA-approved medical therapies in postmenopausal women and men aged 22 years and older, based on the following: ?a) A hip or vertebral (clinical or morphometric) fracture ?b) T-score ? -2.5 at the femoral neck or spine after appropriate evaluation to exclude secondary causes ?C) Low bone mass (T-score between -1.0 and -2.5 at the femoral neck or spine) and a 10-year probability of a hip fracture ? 3% or a 10-year probability of a major osteoporosis-related fracture ? 20% based on the [place]-adapted WHO algorithm ? ? ?5) Routine healthcare maintenance including cholesterol, diabetes screening discussed- Ordered today per her employer incentive program ? ?6) Colonoscopy: had normal Cologuard 2 years ago, due for re-screening next year.  Screening recommended starting at age 33 for average risk individuals, age 37 for individuals deemed at increased risk (including African Americans) and recommended to continue until age 76.  For patient age 65-85 individualized approach is recommended.  Gold standard screening is via colonoscopy, Cologuard screening is an acceptable alternative for patient unwilling or unable to undergo colonoscopy.  "Colorectal cancer screening for average?risk adults: 2018 guideline update from the American Cancer Society"CA: A Cancer Journal for Clinicians: Jun 26, 2016  ? ?7) Return in about 1 year (around 04/26/2022) for annual established gyn. ? ? ? ?Parke Poisson, CNM ?[facility] ?[facility] ?04/25/21, 9:36  AM ? ? ? ? ? ? ? ? ? ? ? ?  ?

## 2021-04-26 LAB — COMPREHENSIVE METABOLIC PANEL
ALT: 11 IU/L (ref 0–32)
AST: 15 IU/L (ref 0–40)
Albumin/Globulin Ratio: 2 (ref 1.2–2.2)
Albumin: 4.4 g/dL (ref 3.8–4.9)
Alkaline Phosphatase: 67 IU/L (ref 44–121)
BUN/Creatinine Ratio: 13 (ref 9–23)
BUN: 12 mg/dL (ref 6–24)
Bilirubin Total: 0.4 mg/dL (ref 0.0–1.2)
CO2: 25 mmol/L (ref 20–29)
Calcium: 9.4 mg/dL (ref 8.7–10.2)
Chloride: 102 mmol/L (ref 96–106)
Creatinine, Ser: 0.93 mg/dL (ref 0.57–1.00)
Globulin, Total: 2.2 g/dL (ref 1.5–4.5)
Glucose: 94 mg/dL (ref 70–99)
Potassium: 4.6 mmol/L (ref 3.5–5.2)
Sodium: 139 mmol/L (ref 134–144)
Total Protein: 6.6 g/dL (ref 6.0–8.5)
eGFR: 73 mL/min/{1.73_m2} (ref >59)

## 2021-04-26 LAB — LIPID PANEL WITH LDL/HDL RATIO
Cholesterol, Total: 200 mg/dL — ABNORMAL HIGH (ref 100–199)
HDL: 71 mg/dL (ref >39)
LDL Chol Calc (NIH): 117 mg/dL — ABNORMAL HIGH (ref 0–99)
LDL/HDL Ratio: 1.6 ratio (ref 0.0–3.2)
Triglycerides: 67 mg/dL (ref 0–149)
VLDL Cholesterol Cal: 12 mg/dL (ref 5–40)

## 2021-05-02 ENCOUNTER — Telehealth: Payer: Self-pay

## 2021-05-02 ENCOUNTER — Encounter: Payer: Self-pay | Admitting: Advanced Practice Midwife

## 2021-05-02 NOTE — Telephone Encounter (Signed)
Pt calling to see if biometrics form has been sent to [facility].  Please let her know as she hasn't heard anything from [facility].  [phone] ?

## 2022-01-29 DIAGNOSIS — Z86018 Personal history of other benign neoplasm: Secondary | ICD-10-CM | POA: Diagnosis not present

## 2022-01-29 DIAGNOSIS — L578 Other skin changes due to chronic exposure to nonionizing radiation: Secondary | ICD-10-CM | POA: Diagnosis not present

## 2022-05-13 ENCOUNTER — Ambulatory Visit
Admission: EM | Admit: 2022-05-13 | Discharge: 2022-05-13 | Disposition: A | Discharge status: Home / Self Care | Payer: BC Managed Care – PPO | Attending: Family Medicine | Admitting: Family Medicine

## 2022-05-13 DIAGNOSIS — S39012A Strain of muscle, fascia and tendon of lower back, initial encounter: Secondary | ICD-10-CM

## 2022-05-13 DIAGNOSIS — M25551 Pain in right hip: Secondary | ICD-10-CM

## 2022-05-13 MED ORDER — CYCLOBENZAPRINE HCL 10 MG PO TABS: 10.0000 mg | ORAL_TABLET | Freq: Three times a day (TID) | ORAL | 0 refills | Status: DC | PRN

## 2022-05-13 MED ORDER — NAPROXEN 500 MG PO TABS: 500.0000 mg | ORAL_TABLET | Freq: Two times a day (BID) | ORAL | 0 refills | Status: AC

## 2022-05-13 NOTE — ED Triage Notes (Addendum)
Pt c/o R sided back pain x3 days, going into hip. Denies any falls or injuries. Pain radiates down leg at times.

## 2022-05-13 NOTE — ED Provider Notes (Signed)
[facility]    CSN: [id number] Arrival date & time: 05/13/22  0909      History   Chief Complaint Chief Complaint  Patient presents with   Back Pain    HPI  HPI Jessica Henson is a 57 y.o. female.   Jessica Henson presents for right sided back pain that started last week.  Its to her right groin.  She has been feeling twitches on and off. No history of back injury or trauma.  There is been no recent heavy lifting.  Pain described as shooting and worse with lifting her leg.  Changes to have pain with movement.  Denies weakness, numbness, tingling, dysuria, leg weakness, abdominal pain, chest pain, incontinence, pelvic pain, perianal numbness.  Took Tylenol prior to arrival.      Past Medical History:  Diagnosis Date   History of mammogram 11/06/2015   Benign BIRADS 2   Obesity (BMI 30.0-34.9)    Situational depression 2015    Patient Active Problem List   Diagnosis Date Noted   Contraceptive management 02/17/2017    Past Surgical History:  Procedure Laterality Date   TONSILLECTOMY  1982    OB History     Gravida  0   Para  0   Term  0   Preterm  0   AB  0   Living  0      SAB  0   IAB  0   Ectopic  0   Multiple  0   Live Births  0            Home Medications    Prior to Admission medications   Medication Sig Start Date End Date Taking? Authorizing Provider  Biotin 1 MG CAPS Take 1 capsule by mouth daily.   Yes [provider]  Calcium 500-100 MG-UNIT CHEW Chew 1 Piece by mouth 2 (two) times daily as needed.   Yes [provider]  cyclobenzaprine (FLEXERIL) 10 MG tablet Take 1 tablet (10 mg total) by mouth 3 (three) times daily as needed for muscle spasms. 05/13/22  Yes Daesia Zylka, Seward Meth, DO  levonorgestrel-ethinyl estradiol (VIENVA) 0.1-20 MG-MCG tablet Take 1 tablet by mouth daily. 04/25/21  Yes Tresea Mall, CNM  Multiple Vitamins-Minerals (MULTIVITAMIN WITH MINERALS) tablet Take 1 tablet by mouth daily.   Yes  [provider]  naproxen (NAPROSYN) 500 MG tablet Take 1 tablet (500 mg total) by mouth 2 (two) times daily with a meal. 05/13/22  Yes Liban Guedes, Seward Meth, DO    Family History Family History  Problem Relation Age of Onset   Colon cancer Mother 55   Lung cancer Mother 44   Lung cancer Father 21   Lung cancer Maternal Aunt    Breast cancer Neg Hx     Social History Social History   Tobacco Use   Smoking status: Never   Smokeless tobacco: Never  Vaping Use   Vaping Use: Never used  Substance Use Topics   Alcohol use: Yes    Comment: twice a month   Drug use: No     Allergies   Patient has no known allergies.   Review of Systems Review of Systems: egative unless otherwise stated in HPI.      Physical Exam Triage Vital Signs ED Triage Vitals  Enc Vitals Group     BP 05/13/22 0919 (!) 131/90     Pulse Rate 05/13/22 0919 (!) 58     Resp 05/13/22 0919 16     Temp 05/13/22  0919 98.6 F (37 C)     Temp Source 05/13/22 0919 Oral     SpO2 05/13/22 0919 97 %     Weight 05/13/22 0921 160 lb (72.6 kg)     Height 05/13/22 0921  (1.676 m)     Head Circumference --      Peak Flow --      Pain Score 05/13/22 0921 6     Pain Loc --      Pain Edu? --      Excl. in GC? --    No data found.  Updated Vital Signs BP (!) 131/90 (BP Location: Left Arm)   Pulse (!) 58   Temp 98.6 F (37 C) (Oral)   Resp 16   Ht  (1.676 m)   Wt 72.6 kg   SpO2 97%   BMI 25.82 kg/m   Visual Acuity Right Eye Distance:   Left Eye Distance:   Bilateral Distance:    Right Eye Near:   Left Eye Near:    Bilateral Near:     Physical Exam GEN: well appearing female in no acute distress  CVS: well perfused  RESP: speaking in full sentences without pause, no respiratory distress  MSK:  Lumbar spine: - Inspection: no gross deformity or asymmetry, swelling or ecchymosis. No skin changes  - Palpation: No TTP over the spinous processes, no bilateral lumbar paraspinal  muscles, no SI joint tenderness bilaterally - ROM: full active ROM of the lumbar spine in flexion and extension  - Strength: 5/5 strength of lower extremity in L4-S1 nerve root distributions b/l - Neuro: sensation intact in the L4-S1 nerve root distribution b/l - Special testing: postive straight leg raise  Hip, right: No TTP noted. No obvious rash, deformity, erythema, ecchymosis, or edema. Passive Log Roll equivalent b/l without restriction. ROM full in all directions; Strength 5/5 in IR/ER/Flex/Ext/Abd/Add. Pelvic alignment unremarkable to inspection and palpation. Greater trochanter without tenderness to palpation. No tenderness over piriformis.  Neurovascularly intact. Positive FABER SKIN: warm, dry, no overly skin rash or erythema     UC Treatments / Results  Labs (all labs ordered are listed, but only abnormal results are displayed) Labs Reviewed - No data to display  EKG   Radiology No results found.   Procedures Procedures (including critical care time)  Medications Ordered in UC Medications - No data to display  Initial Impression / Assessment and Plan / UC Course  I have reviewed the triage vital signs and the nursing notes.  Pertinent labs & imaging results that were available during my care of the patient were reviewed by me and considered in my medical decision making (see chart for details).      Pt is a 57 y.o.  female with 1 week of right lower back pain that radiates to her right groin.  Exam concerning for musculoskeletal strain with possible lumbar radiculopathy.  After shared decision making, we will defer imaging at this time.   Patient to gradually return to normal activities, as tolerated and continue ordinary activities within the limits permitted by pain. Prescribed Naproxen sodium  and muscle relaxer  for pain relief.  Advised patient to avoid other NSAIDs while taking Naprosyn. Tylenol and Lidocaine patches PRN for multimodal pain relief. Counseled  patient on red flag symptoms and when to seek immediate care.  No red flags suggesting cauda equina syndrome or progressive major motor weakness. Patient to follow up with orthopedic provider, if symptoms do not improve with conservative  treatment.  Return and ED precautions given.    Discussed MDM, treatment plan and plan for follow-up with patient/parent who agrees with plan.   Final Clinical Impressions(s) / UC Diagnoses   Final diagnoses:  Right hip pain  Strain of lumbar region, initial encounter     Discharge Instructions      If medication was prescribed, stop by the pharmacy to pick up your prescriptions.  For your back pain, Take 1000 mg Tylenol twice a day, take muscle relaxer (Flexeril) 2-3 times a day, take Naprosyn twice a day,  as needed for pain. Consider stopping by the pharmacy or dollar store to pick up some Lidocaine patches. Apply for 12 hours and then remove.   Watch for worsening symptoms such as an increasing weakness or loss of sensation in your arms or legs, increasing pain and/or the loss of bladder or bowel function. Should any of these occur, go to the emergency department immediately.       ED Prescriptions     Medication Sig Dispense Auth. Provider   naproxen (NAPROSYN) 500 MG tablet Take 1 tablet (500 mg total) by mouth 2 (two) times daily with a meal. 30 tablet Jeovani Weisenburger, DO   cyclobenzaprine (FLEXERIL) 10 MG tablet Take 1 tablet (10 mg total) by mouth 3 (three) times daily as needed for muscle spasms. 30 tablet Katha Cabal, DO      PDMP not reviewed this encounter.   Katha Cabal, DO 05/13/22 1105

## 2022-05-13 NOTE — Discharge Instructions (Signed)
If medication was prescribed, stop by the pharmacy to pick up your prescriptions.  For your back pain, Take 1000 mg Tylenol twice a day, take muscle relaxer (Flexeril) 2-3 times a day, take Naprosyn twice a day,  as needed for pain. Consider stopping by the pharmacy or dollar store to pick up some Lidocaine patches. Apply for 12 hours and then remove.   Watch for worsening symptoms such as an increasing weakness or loss of sensation in your arms or legs, increasing pain and/or the loss of bladder or bowel function. Should any of these occur, go to the emergency department immediately.

## 2022-05-14 ENCOUNTER — Other Ambulatory Visit: Payer: Self-pay

## 2022-05-14 DIAGNOSIS — Z3041 Encounter for surveillance of contraceptive pills: Secondary | ICD-10-CM

## 2022-05-14 DIAGNOSIS — Z0000 Encounter for general adult medical examination without abnormal findings: Secondary | ICD-10-CM

## 2022-05-14 MED ORDER — LEVONORGESTREL-ETHINYL ESTRAD 0.1-20 MG-MCG PO TABS: 1.0000 | ORAL_TABLET | Freq: Every day | ORAL | 0 refills | Status: DC

## 2022-05-14 NOTE — Telephone Encounter (Signed)
Pt calling for 68m refill of bc to get her to her annual appt.  [facility]  [phone]  Pt aware refill eRx'd.

## 2022-06-07 ENCOUNTER — Encounter: Payer: Self-pay | Admitting: Obstetrics and Gynecology

## 2022-06-07 ENCOUNTER — Ambulatory Visit (INDEPENDENT_AMBULATORY_CARE_PROVIDER_SITE_OTHER): Payer: BC Managed Care – PPO | Admitting: Obstetrics and Gynecology

## 2022-06-07 ENCOUNTER — Other Ambulatory Visit (HOSPITAL_COMMUNITY)
Admission: RE | Admit: 2022-06-07 | Discharge: 2022-06-07 | Disposition: A | Discharge status: Home / Self Care | Payer: BC Managed Care – PPO | Source: Ambulatory Visit | Attending: Obstetrics and Gynecology | Admitting: Obstetrics and Gynecology

## 2022-06-07 ENCOUNTER — Other Ambulatory Visit: Payer: Self-pay | Admitting: Obstetrics and Gynecology

## 2022-06-07 VITALS — BP 112/77 | HR 80 | Ht 66.0 in | Wt 163.8 lb

## 2022-06-07 DIAGNOSIS — Z124 Encounter for screening for malignant neoplasm of cervix: Secondary | ICD-10-CM

## 2022-06-07 DIAGNOSIS — Z0000 Encounter for general adult medical examination without abnormal findings: Secondary | ICD-10-CM | POA: Diagnosis not present

## 2022-06-07 DIAGNOSIS — Z1231 Encounter for screening mammogram for malignant neoplasm of breast: Secondary | ICD-10-CM

## 2022-06-07 DIAGNOSIS — Z01419 Encounter for gynecological examination (general) (routine) without abnormal findings: Secondary | ICD-10-CM | POA: Diagnosis not present

## 2022-06-07 DIAGNOSIS — Z3041 Encounter for surveillance of contraceptive pills: Secondary | ICD-10-CM

## 2022-06-07 MED ORDER — LEVONORGESTREL-ETHINYL ESTRAD 0.1-20 MG-MCG PO TABS: 1.0000 | ORAL_TABLET | Freq: Every day | ORAL | 3 refills | Status: DC

## 2022-06-07 NOTE — Progress Notes (Signed)
Patients presents for annual exam today. She states noticing her cycles are becoming more irregular in when they are starting, currently taking OCP's and would like to continue. Patient is due for pap smear and mammogram, ordered. Annual labs are ordered. She states no other questions or concerns at this time.

## 2022-06-07 NOTE — Progress Notes (Signed)
HPI:      [name] is a 57 y.o. G0P0000 who LMP was Patient's last menstrual period was 05/13/2022 (approximate).  Subjective:   She presents today for her annual examination.  She is taking OCPs and having normal regular cycles although they have changed a little bit in the last several months.  She is primarily taking them for birth control.  She reports that during her off week she does not have hot flashes.    Hx: The following portions of the patient's history were reviewed and updated as appropriate:             She  has a past medical history of History of mammogram (11/06/2015), Obesity (BMI 30.0-34.9), and Situational depression (2015). She does not have any pertinent problems on file. She  has a past surgical history that includes Tonsillectomy (1982). Her family history includes Colon cancer (age of onset: 21) in her mother; Lung cancer in her maternal aunt; Lung cancer (age of onset: 54) in her father; Lung cancer (age of onset: 48) in her mother. She  reports that she has never smoked. She has never used smokeless tobacco. She reports current alcohol use. She reports that she does not use drugs. She has a current medication list which includes the following prescription(s): biotin, calcium, cyclobenzaprine, multivitamin with minerals, naproxen, and levonorgestrel-ethinyl estradiol. She has No Known Allergies.       Review of Systems:  Review of Systems  Constitutional: Denied constitutional symptoms, night sweats, recent illness, fatigue, fever, insomnia and weight loss.  Eyes: Denied eye symptoms, eye pain, photophobia, vision change and visual disturbance.  Ears/Nose/Throat/Neck: Denied ear, nose, throat or neck symptoms, hearing loss, nasal discharge, sinus congestion and sore throat.  Cardiovascular: Denied cardiovascular symptoms, arrhythmia, chest pain/pressure, edema, exercise intolerance, orthopnea and palpitations.  Respiratory: Denied pulmonary symptoms, asthma,  pleuritic pain, productive sputum, cough, dyspnea and wheezing.  Gastrointestinal: Denied, gastro-esophageal reflux, melena, nausea and vomiting.  Genitourinary: Denied genitourinary symptoms including symptomatic vaginal discharge, pelvic relaxation issues, and urinary complaints.  Musculoskeletal: Denied musculoskeletal symptoms, stiffness, swelling, muscle weakness and myalgia.  Dermatologic: Denied dermatology symptoms, rash and scar.  Neurologic: Denied neurology symptoms, dizziness, headache, neck pain and syncope.  Psychiatric: Denied psychiatric symptoms, anxiety and depression.  Endocrine: Denied endocrine symptoms including hot flashes and night sweats.   Meds:   Current Outpatient Medications on File Prior to Visit  Medication Sig Dispense Refill   Biotin 1 MG CAPS Take 1 capsule by mouth daily.     Calcium 500-100 MG-UNIT CHEW Chew 1 Piece by mouth 2 (two) times daily as needed.     cyclobenzaprine (FLEXERIL) 10 MG tablet Take 1 tablet (10 mg total) by mouth 3 (three) times daily as needed for muscle spasms. 30 tablet 0   Multiple Vitamins-Minerals (MULTIVITAMIN WITH MINERALS) tablet Take 1 tablet by mouth daily.     naproxen (NAPROSYN) 500 MG tablet Take 1 tablet (500 mg total) by mouth 2 (two) times daily with a meal. 30 tablet 0   No current facility-administered medications on file prior to visit.     Objective:     Vitals:   06/07/22 0822  BP: 112/77  Pulse: 80    Filed Weights   06/07/22 0822  Weight: 163 lb 12.8 oz (74.3 kg)              Physical examination General NAD, Conversant  HEENT Atraumatic; Op clear with mmm.  Normo-cephalic. Pupils reactive. Anicteric sclerae  Thyroid/Neck Smooth  without nodularity or enlargement. Normal ROM.  Neck Supple.  Skin No rashes, lesions or ulceration. Normal palpated skin turgor. No nodularity.  Breasts: No masses or discharge.  Symmetric.  No axillary adenopathy.  Lungs: Clear to auscultation.No rales or wheezes.  Normal Respiratory effort, no retractions.  Heart: NSR.  No murmurs or rubs appreciated. No peripheral edema  Abdomen: Soft.  Non-tender.  No masses.  No HSM. No hernia  Extremities: Moves all appropriately.  Normal ROM for age. No lymphadenopathy.  Neuro: Oriented to PPT.  Normal mood. Normal affect.     Pelvic:   Vulva: Normal appearance.  No lesions.  Vagina: No lesions or abnormalities noted.  Support: Normal pelvic support.  Urethra No masses tenderness or scarring.  Meatus Normal size without lesions or prolapse.  Cervix: Normal appearance.  No lesions.  Anus: Normal exam.  No lesions.  Perineum: Normal exam.  No lesions.        Bimanual   Uterus: Normal size.  Non-tender.  Mobile.  AV.  Adnexae: No masses.  Non-tender to palpation.  Cul-de-sac: Negative for abnormality.     Assessment:    G0P0000 Patient Active Problem List   Diagnosis Date Noted   Contraceptive management 02/17/2017     1. Well woman exam without gynecological exam   2. Screening mammogram for breast cancer   3. Cervical cancer screening   4. Encounter for surveillance of contraceptive pills     Patient to several years past the average age of menopause.  Possibly in menopause but she is taking OCPs so masking this.   Plan:            1.  Basic Screening Recommendations The basic screening recommendations for asymptomatic women were discussed with the patient during her visit.  The age-appropriate recommendations were discussed with her and the rational for the tests reviewed.  When I am informed by the patient that another primary care physician has previously obtained the age-appropriate tests and they are up-to-date, only outstanding tests are ordered and referrals given as necessary.  Abnormal results of tests will be discussed with her when all of her results are completed.  Routine preventative health maintenance measures emphasized: Exercise/Diet/Weight control, Tobacco Warnings,  Alcohol/Substance use risks and Stress Management Pap performed-mammogram ordered-blood work today. 2.  Patient will take her last week of OCPs (placebo) and then take a week off and have FSH testing.  We will discuss menopause if FSH elevated.  If not continue OCPs until next year. Possible video visit to discuss menopause and HRT if necessary. Orders Orders Placed This Encounter  Procedures   MM DIGITAL SCREENING BILATERAL   Basic metabolic panel   CBC   TSH   Lipid panel   Hemoglobin A1c   Follicle stimulating hormone     Meds ordered this encounter  Medications   levonorgestrel-ethinyl estradiol (VIENVA) 0.1-20 MG-MCG tablet    Sig: Take 1 tablet by mouth daily.    Dispense:  84 tablet    Refill:  3          F/U  No follow-ups on file.  Elonda Husky, M.D. 06/07/2022 8:57 AM

## 2022-06-08 LAB — LIPID PANEL
Chol/HDL Ratio: 2.7 ratio (ref 0.0–4.4)
Cholesterol, Total: 197 mg/dL (ref 100–199)
HDL: 74 mg/dL (ref >39)
LDL Chol Calc (NIH): 106 mg/dL — ABNORMAL HIGH (ref 0–99)
Triglycerides: 94 mg/dL (ref 0–149)
VLDL Cholesterol Cal: 17 mg/dL (ref 5–40)

## 2022-06-08 LAB — BASIC METABOLIC PANEL
BUN/Creatinine Ratio: 15 (ref 9–23)
BUN: 13 mg/dL (ref 6–24)
CO2: 23 mmol/L (ref 20–29)
Calcium: 9 mg/dL (ref 8.7–10.2)
Chloride: 104 mmol/L (ref 96–106)
Creatinine, Ser: 0.85 mg/dL (ref 0.57–1.00)
Glucose: 79 mg/dL (ref 70–99)
Potassium: 4.6 mmol/L (ref 3.5–5.2)
Sodium: 141 mmol/L (ref 134–144)
eGFR: 81 mL/min/{1.73_m2} (ref >59)

## 2022-06-08 LAB — HEMOGLOBIN A1C
Est. average glucose Bld gHb Est-mCnc: 111 mg/dL
Hgb A1c MFr Bld: 5.5 % (ref 4.8–5.6)

## 2022-06-08 LAB — CBC
Hematocrit: 43.7 % (ref 34.0–46.6)
Hemoglobin: 14.4 g/dL (ref 11.1–15.9)
MCH: 31.4 pg (ref 26.6–33.0)
MCHC: 33 g/dL (ref 31.5–35.7)
MCV: 95 fL (ref 79–97)
Platelets: 274 10*3/uL (ref 150–450)
RBC: 4.59 x10E6/uL (ref 3.77–5.28)
RDW: 13.2 % (ref 11.7–15.4)
WBC: 7 10*3/uL (ref 3.4–10.8)

## 2022-06-08 LAB — TSH: TSH: 2 u[IU]/mL (ref 0.450–4.500)

## 2022-06-10 LAB — CYTOLOGY - PAP
Adequacy: ABSENT
Comment: NEGATIVE
Diagnosis: NEGATIVE
High risk HPV: NEGATIVE

## 2022-06-25 ENCOUNTER — Other Ambulatory Visit: Payer: BC Managed Care – PPO

## 2022-06-25 DIAGNOSIS — Z0000 Encounter for general adult medical examination without abnormal findings: Secondary | ICD-10-CM | POA: Diagnosis not present

## 2022-06-26 LAB — FOLLICLE STIMULATING HORMONE: FSH: 15.8 m[IU]/mL

## 2022-06-27 ENCOUNTER — Ambulatory Visit
Admission: RE | Admit: 2022-06-27 | Discharge: 2022-06-27 | Disposition: A | Discharge status: Home / Self Care | Payer: BC Managed Care – PPO | Source: Ambulatory Visit | Attending: Obstetrics and Gynecology | Admitting: Obstetrics and Gynecology

## 2022-06-27 DIAGNOSIS — Z1231 Encounter for screening mammogram for malignant neoplasm of breast: Secondary | ICD-10-CM | POA: Diagnosis not present

## 2022-07-01 ENCOUNTER — Other Ambulatory Visit: Payer: Self-pay | Admitting: Obstetrics and Gynecology

## 2022-07-01 DIAGNOSIS — R928 Other abnormal and inconclusive findings on diagnostic imaging of breast: Secondary | ICD-10-CM

## 2022-07-01 DIAGNOSIS — R921 Mammographic calcification found on diagnostic imaging of breast: Secondary | ICD-10-CM

## 2022-07-09 ENCOUNTER — Encounter: Payer: Self-pay | Admitting: Obstetrics and Gynecology

## 2022-07-16 ENCOUNTER — Ambulatory Visit
Admission: RE | Admit: 2022-07-16 | Discharge: 2022-07-16 | Disposition: A | Discharge status: Home / Self Care | Payer: BC Managed Care – PPO | Source: Ambulatory Visit | Attending: Obstetrics and Gynecology | Admitting: Obstetrics and Gynecology

## 2022-07-16 DIAGNOSIS — R928 Other abnormal and inconclusive findings on diagnostic imaging of breast: Secondary | ICD-10-CM | POA: Insufficient documentation

## 2022-07-16 DIAGNOSIS — R921 Mammographic calcification found on diagnostic imaging of breast: Secondary | ICD-10-CM | POA: Diagnosis not present

## 2023-06-18 ENCOUNTER — Other Ambulatory Visit: Payer: Self-pay | Admitting: Obstetrics and Gynecology

## 2023-06-18 DIAGNOSIS — Z0000 Encounter for general adult medical examination without abnormal findings: Secondary | ICD-10-CM

## 2023-06-18 DIAGNOSIS — Z3041 Encounter for surveillance of contraceptive pills: Secondary | ICD-10-CM

## 2023-07-03 IMAGING — MG MM DIGITAL SCREENING BILAT W/ TOMO AND CAD
8 series · 8 of 24 positions shown · non-contrast
Comparison: Previous exam(s).

CLINICAL DATA: Screening.

EXAM:
DIGITAL SCREENING BILATERAL MAMMOGRAM WITH TOMOSYNTHESIS AND CAD
TECHNIQUE: Bilateral screening digital craniocaudal and mediolateral oblique
mammograms were obtained. Bilateral screening digital breast
tomosynthesis was performed. The images were evaluated with
computer-aided detection.

[R MLO synth-2D]
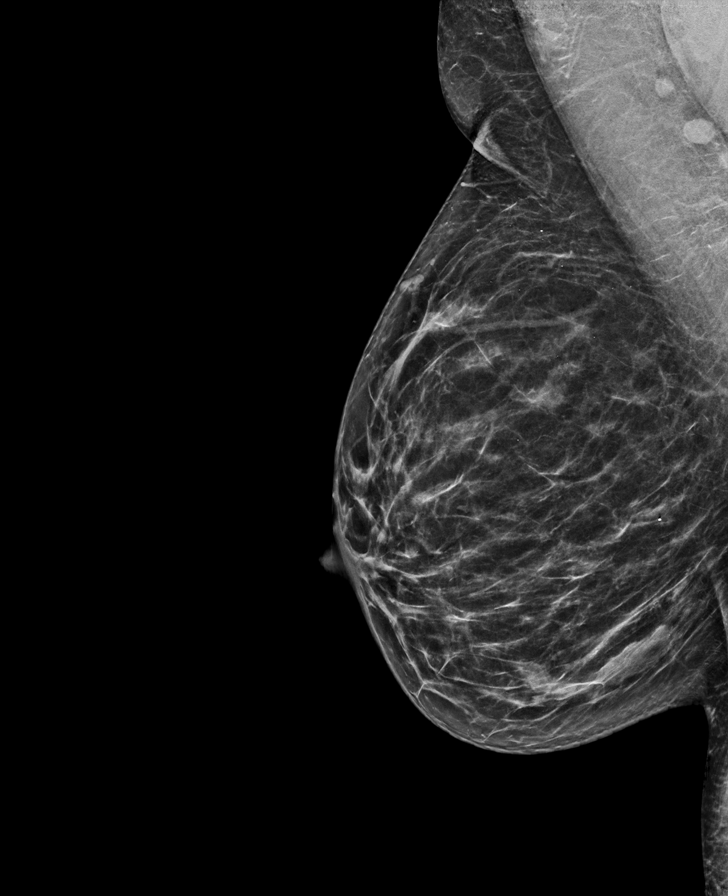

[L CC synth-2D]
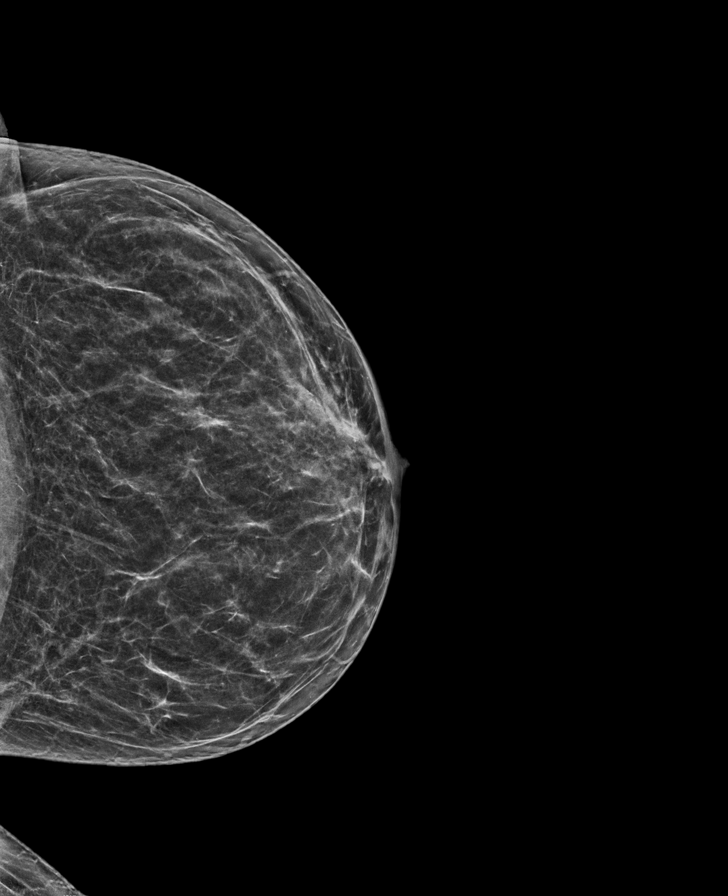

[R CC synth-2D]
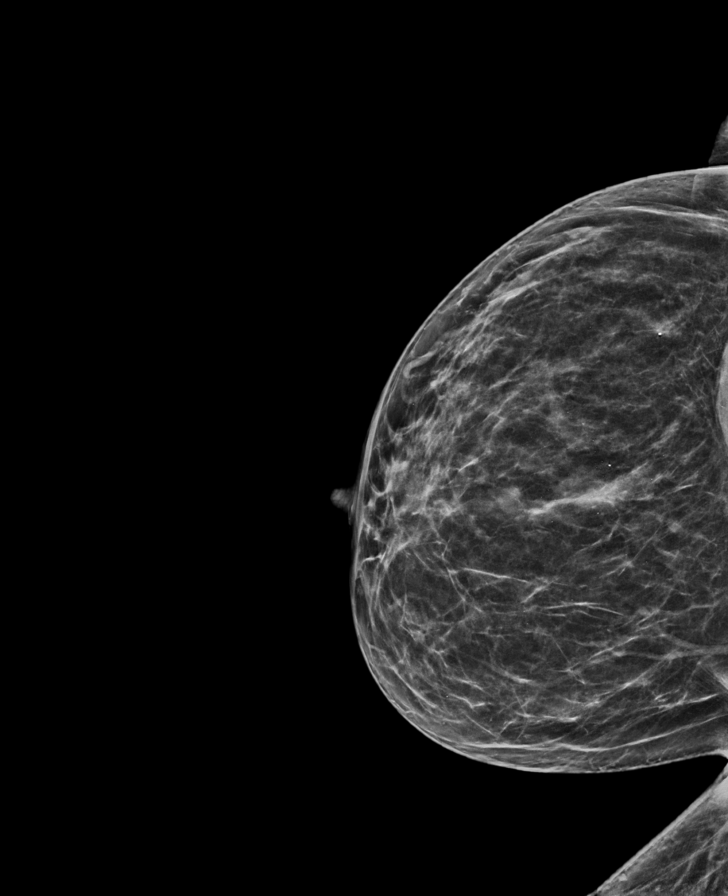

[L MLO synth-2D]
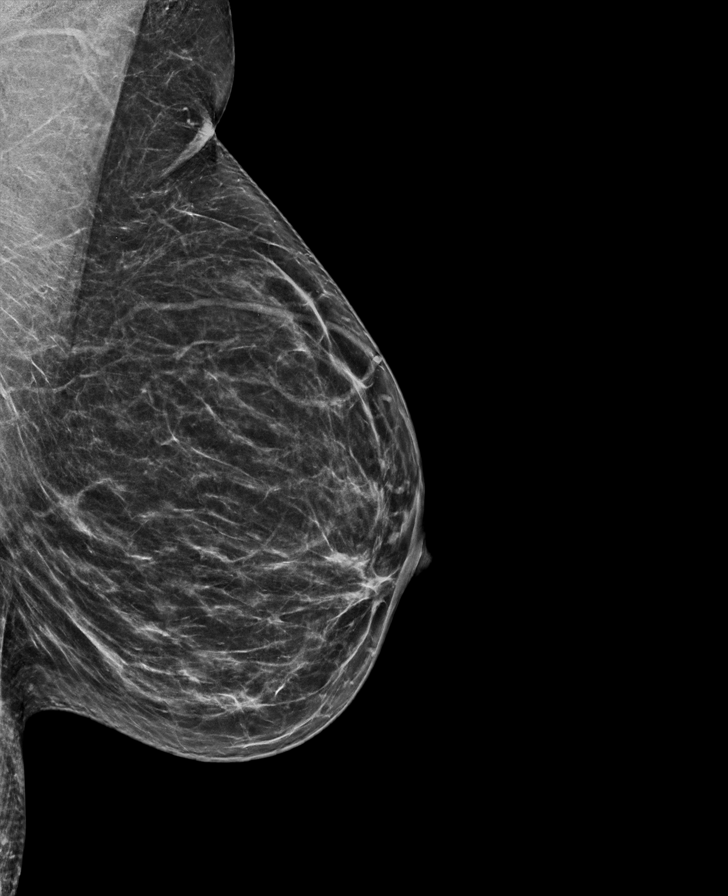

[L MLO tomo · tomo slice 35/70.0]
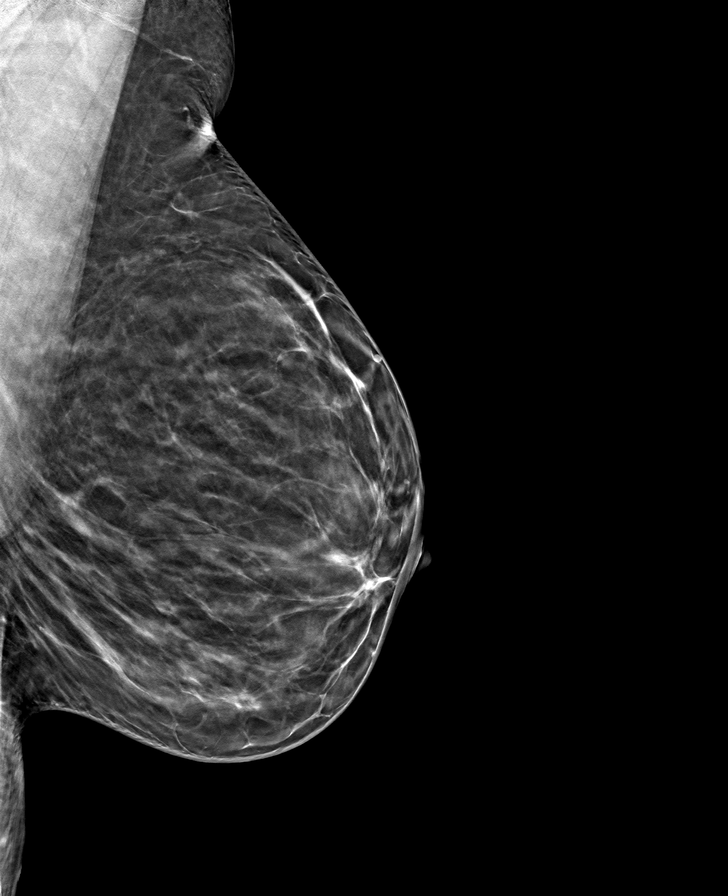

[R CC tomo · tomo slice 33/66.0]
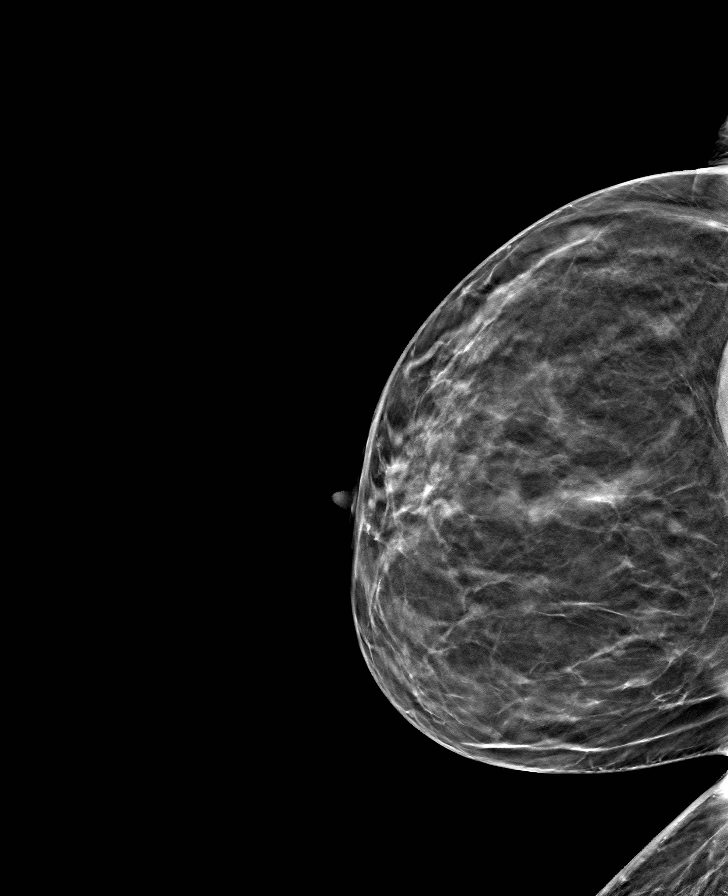

[R MLO tomo · tomo slice 35/70.0]
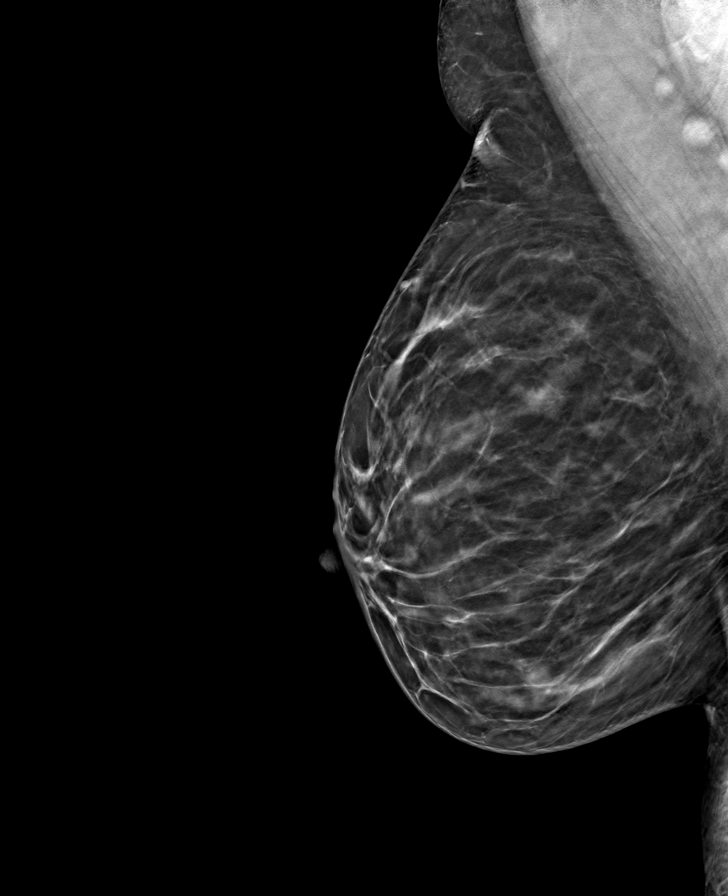

[L CC tomo · tomo slice 31/62.0]
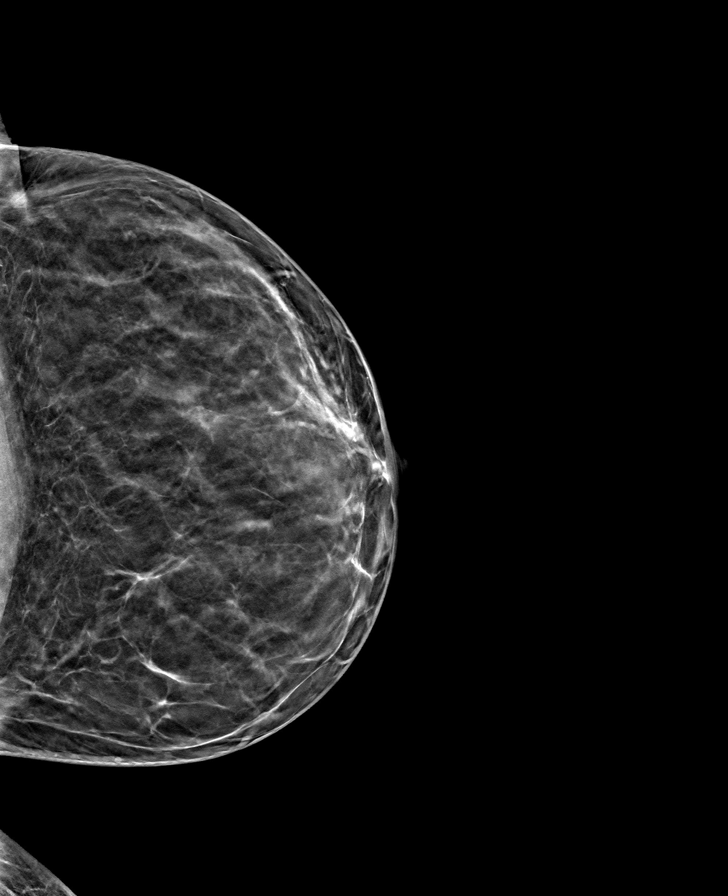

[8 of 24 positions shown; findings below may reference images not displayed]

ACR Breast Density Category b: There are scattered areas of
fibroglandular density.
FINDINGS: There are no findings suspicious for malignancy.
IMPRESSION: No mammographic evidence of malignancy. A result letter of this
screening mammogram will be mailed directly to the patient.

RECOMMENDATION:
Screening mammogram in one year. (Code:51-O-LD2)

BI-RADS CATEGORY  1: Negative.

## 2023-07-08 ENCOUNTER — Telehealth: Payer: Self-pay

## 2023-07-08 ENCOUNTER — Other Ambulatory Visit: Payer: Self-pay | Admitting: Obstetrics and Gynecology

## 2023-07-08 DIAGNOSIS — Z1231 Encounter for screening mammogram for malignant neoplasm of breast: Secondary | ICD-10-CM

## 2023-07-08 DIAGNOSIS — Z3041 Encounter for surveillance of contraceptive pills: Secondary | ICD-10-CM

## 2023-07-08 DIAGNOSIS — Z0000 Encounter for general adult medical examination without abnormal findings: Secondary | ICD-10-CM

## 2023-07-08 MED ORDER — LEVONORGESTREL-ETHINYL ESTRAD 0.1-20 MG-MCG PO TABS: 1.0000 | ORAL_TABLET | Freq: Every day | ORAL | 0 refills | Status: DC

## 2023-07-08 NOTE — Telephone Encounter (Signed)
 Pt needed a refill on BC to last her until her appointment in july

## 2023-08-01 ENCOUNTER — Other Ambulatory Visit: Payer: Self-pay | Admitting: Obstetrics and Gynecology

## 2023-08-01 DIAGNOSIS — Z3041 Encounter for surveillance of contraceptive pills: Secondary | ICD-10-CM

## 2023-08-01 DIAGNOSIS — Z0000 Encounter for general adult medical examination without abnormal findings: Secondary | ICD-10-CM

## 2023-08-05 ENCOUNTER — Ambulatory Visit: Admitting: Obstetrics and Gynecology

## 2023-08-07 ENCOUNTER — Ambulatory Visit (INDEPENDENT_AMBULATORY_CARE_PROVIDER_SITE_OTHER): Admitting: Obstetrics and Gynecology

## 2023-08-07 ENCOUNTER — Encounter: Payer: Self-pay | Admitting: Obstetrics and Gynecology

## 2023-08-07 ENCOUNTER — Ambulatory Visit
Admission: RE | Admit: 2023-08-07 | Discharge: 2023-08-07 | Disposition: A | Discharge status: Home / Self Care | Source: Ambulatory Visit | Attending: Obstetrics and Gynecology | Admitting: Obstetrics and Gynecology

## 2023-08-07 VITALS — BP 121/87 | HR 73 | Ht 66.0 in | Wt 168.5 lb

## 2023-08-07 DIAGNOSIS — Z3041 Encounter for surveillance of contraceptive pills: Secondary | ICD-10-CM | POA: Diagnosis not present

## 2023-08-07 DIAGNOSIS — N951 Menopausal and female climacteric states: Secondary | ICD-10-CM

## 2023-08-07 DIAGNOSIS — Z0000 Encounter for general adult medical examination without abnormal findings: Secondary | ICD-10-CM | POA: Diagnosis not present

## 2023-08-07 DIAGNOSIS — Z1231 Encounter for screening mammogram for malignant neoplasm of breast: Secondary | ICD-10-CM | POA: Diagnosis not present

## 2023-08-07 DIAGNOSIS — Z1211 Encounter for screening for malignant neoplasm of colon: Secondary | ICD-10-CM

## 2023-08-07 MED ORDER — LEVONORGESTREL-ETHINYL ESTRAD 0.1-20 MG-MCG PO TABS: 1.0000 | ORAL_TABLET | Freq: Every day | ORAL | 3 refills | Status: DC

## 2023-08-07 NOTE — Progress Notes (Signed)
 HPI:      [name] is a 58 y.o. G0P0000 who LMP was Patient's last menstrual period was 07/16/2023 (approximate).  Subjective:   She presents today for her annual examination.  She has been taking OCPs and having normal regular cycles.  She was tested last year and found to be not yet in menopause.  She reports that her periods have become somewhat lighter.  She has now been off OCPs for 3 weeks in anticipation of having another FSH drawn.  She reports no hot flashes.  She does report some weight gain over the last year but says this may be diet related or less exercise than usual.    Hx: The following portions of the patient's history were reviewed and updated as appropriate:             She  has a past medical history of History of mammogram (11/06/2015), Obesity (BMI 30.0-34.9), and Situational depression (2015). She does not have any pertinent problems on file. She  has a past surgical history that includes Tonsillectomy (1982). Her family history includes Colon cancer (age of onset: 68) in her mother; Lung cancer in her maternal aunt; Lung cancer (age of onset: 61) in her father; Lung cancer (age of onset: 42) in her mother. She  reports that she has never smoked. She has never used smokeless tobacco. She reports current alcohol use. She reports that she does not use drugs. She has a current medication list which includes the following prescription(s): biotin, calcium, cyclobenzaprine , multivitamin with minerals, levonorgestrel -ethinyl estradiol, and naproxen . She has no known allergies.       Review of Systems:  Review of Systems  Constitutional: Denied constitutional symptoms, night sweats, recent illness, fatigue, fever, insomnia and weight loss.  Eyes: Denied eye symptoms, eye pain, photophobia, vision change and visual disturbance.  Ears/Nose/Throat/Neck: Denied ear, nose, throat or neck symptoms, hearing loss, nasal discharge, sinus congestion and sore throat.  Cardiovascular:  Denied cardiovascular symptoms, arrhythmia, chest pain/pressure, edema, exercise intolerance, orthopnea and palpitations.  Respiratory: Denied pulmonary symptoms, asthma, pleuritic pain, productive sputum, cough, dyspnea and wheezing.  Gastrointestinal: Denied, gastro-esophageal reflux, melena, nausea and vomiting.  Genitourinary: Denied genitourinary symptoms including symptomatic vaginal discharge, pelvic relaxation issues, and urinary complaints.  Musculoskeletal: Denied musculoskeletal symptoms, stiffness, swelling, muscle weakness and myalgia.  Dermatologic: Denied dermatology symptoms, rash and scar.  Neurologic: Denied neurology symptoms, dizziness, headache, neck pain and syncope.  Psychiatric: Denied psychiatric symptoms, anxiety and depression.  Endocrine: Denied endocrine symptoms including hot flashes and night sweats.   Meds:   Current Outpatient Medications on File Prior to Visit  Medication Sig Dispense Refill   Biotin 1 MG CAPS Take 1 capsule by mouth daily.     Calcium 500-100 MG-UNIT CHEW Chew 1 Piece by mouth 2 (two) times daily as needed.     cyclobenzaprine  (FLEXERIL ) 10 MG tablet Take 1 tablet (10 mg total) by mouth 3 (three) times daily as needed for muscle spasms. 30 tablet 0   Multiple Vitamins-Minerals (MULTIVITAMIN WITH MINERALS) tablet Take 1 tablet by mouth daily.     naproxen  (NAPROSYN ) 500 MG tablet Take 1 tablet (500 mg total) by mouth 2 (two) times daily with a meal. (Patient not taking: Reported on 08/07/2023) 30 tablet 0   No current facility-administered medications on file prior to visit.     Objective:     Vitals:   08/07/23 0928  BP: 121/87  Pulse: 73    Filed Weights   08/07/23 0928  Weight: 168 lb 8 oz (76.4 kg)              She has deferred examination today as she has no symptoms and is up-to-date with her screening tests.  Assessment:    G0P0000 Patient Active Problem List   Diagnosis Date Noted   Contraceptive management  02/17/2017     1. Well woman exam without gynecological exam   2. Encounter for surveillance of contraceptive pills   3. Perimenopausal   4. Colon cancer screening        Plan:            1.  Basic Screening Recommendations The basic screening recommendations for asymptomatic women were discussed with the patient during her visit.  The age-appropriate recommendations were discussed with her and the rational for the tests reviewed.  When I am informed by the patient that another primary care physician has previously obtained the age-appropriate tests and they are up-to-date, only outstanding tests are ordered and referrals given as necessary.  Abnormal results of tests will be discussed with her when all of her results are completed.  Routine preventative health maintenance measures emphasized: Exercise/Diet/Weight control, Tobacco Warnings, Alcohol/Substance use risks and Stress Management Blood work ordered colluding FSH 2.  If she remains premenopausal we will continue OCPs for another year. 3.  Plans Cologuard for screening  Orders Orders Placed This Encounter  Procedures   FSH   Basic metabolic panel with GFR   CBC   Cologuard   Hemoglobin A1c   Lipid panel   TSH     Meds ordered this encounter  Medications   levonorgestrel -ethinyl estradiol (VIENVA) 0.1-20 MG-MCG tablet    Sig: Take 1 tablet by mouth daily.    Dispense:  84 tablet    Refill:  3          F/U  Return in about 1 year (around 08/06/2024) for Annual Physical, We will contact her with any abnormal test results.  Alm DOROTHA Sar, M.D. 08/07/2023 9:58 AM

## 2023-08-07 NOTE — Progress Notes (Signed)
 Patients presents for annual exam today. She states discontinuing her OCP a few weeks ago for labs today, declines menopausal symptoms but states her cycles have become lighter. Up to date on pap smear and mammogram. Cologuard ordered. Annual labs are ordered. She states no other questions or concerns at this time.

## 2023-08-08 ENCOUNTER — Ambulatory Visit: Payer: Self-pay

## 2023-08-08 LAB — CBC
Hematocrit: 46.5 % (ref 34.0–46.6)
Hemoglobin: 14.8 g/dL (ref 11.1–15.9)
MCH: 30.9 pg (ref 26.6–33.0)
MCHC: 31.8 g/dL (ref 31.5–35.7)
MCV: 97 fL (ref 79–97)
Platelets: 262 x10E3/uL (ref 150–450)
RBC: 4.79 x10E6/uL (ref 3.77–5.28)
RDW: 13.1 % (ref 11.7–15.4)
WBC: 5.2 x10E3/uL (ref 3.4–10.8)

## 2023-08-08 LAB — BASIC METABOLIC PANEL WITH GFR
BUN/Creatinine Ratio: 20 (ref 9–23)
BUN: 17 mg/dL (ref 6–24)
CO2: 24 mmol/L (ref 20–29)
Calcium: 9.7 mg/dL (ref 8.7–10.2)
Chloride: 102 mmol/L (ref 96–106)
Creatinine, Ser: 0.84 mg/dL (ref 0.57–1.00)
Glucose: 93 mg/dL (ref 70–99)
Potassium: 4.6 mmol/L (ref 3.5–5.2)
Sodium: 142 mmol/L (ref 134–144)
eGFR: 82 mL/min/1.73 (ref >59)

## 2023-08-08 LAB — FOLLICLE STIMULATING HORMONE: FSH: 56.7 m[IU]/mL

## 2023-08-08 LAB — LIPID PANEL
Chol/HDL Ratio: 3 ratio (ref 0.0–4.4)
Cholesterol, Total: 213 mg/dL — ABNORMAL HIGH (ref 100–199)
HDL: 70 mg/dL (ref >39)
LDL Chol Calc (NIH): 128 mg/dL — ABNORMAL HIGH (ref 0–99)
Triglycerides: 83 mg/dL (ref 0–149)
VLDL Cholesterol Cal: 15 mg/dL (ref 5–40)

## 2023-08-08 LAB — HEMOGLOBIN A1C
Est. average glucose Bld gHb Est-mCnc: 117 mg/dL
Hgb A1c MFr Bld: 5.7 % — ABNORMAL HIGH (ref 4.8–5.6)

## 2023-08-08 LAB — TSH: TSH: 1.66 u[IU]/mL (ref 0.450–4.500)

## 2023-08-12 ENCOUNTER — Other Ambulatory Visit: Payer: Self-pay | Admitting: Obstetrics and Gynecology

## 2023-08-12 DIAGNOSIS — R928 Other abnormal and inconclusive findings on diagnostic imaging of breast: Secondary | ICD-10-CM

## 2023-08-18 ENCOUNTER — Ambulatory Visit
Admission: RE | Admit: 2023-08-18 | Discharge: 2023-08-18 | Disposition: A | Discharge status: Home / Self Care | Source: Ambulatory Visit | Attending: Obstetrics and Gynecology | Admitting: Obstetrics and Gynecology

## 2023-08-18 DIAGNOSIS — R92322 Mammographic fibroglandular density, left breast: Secondary | ICD-10-CM | POA: Diagnosis not present

## 2023-08-18 DIAGNOSIS — R928 Other abnormal and inconclusive findings on diagnostic imaging of breast: Secondary | ICD-10-CM

## 2023-08-18 DIAGNOSIS — N6323 Unspecified lump in the left breast, lower outer quadrant: Secondary | ICD-10-CM | POA: Diagnosis not present

## 2023-08-19 ENCOUNTER — Ambulatory Visit: Payer: Self-pay

## 2023-08-19 ENCOUNTER — Telehealth: Payer: Self-pay

## 2023-08-19 NOTE — Telephone Encounter (Signed)
 Patient is calling back to confirm that form has been faxed. She said you were having trouble with the fax the last time.

## 2023-08-19 NOTE — Telephone Encounter (Signed)
 Patient is calling about a physician form that was suppose to be completed and submitted. She was seen on 08/07/2023. I don't see a form scanned into [employer]. Please call patient with updates on form [phone].

## 2023-08-22 DIAGNOSIS — Z1211 Encounter for screening for malignant neoplasm of colon: Secondary | ICD-10-CM | POA: Diagnosis not present

## 2023-09-01 LAB — COLOGUARD: COLOGUARD: NEGATIVE

## 2023-10-02 ENCOUNTER — Telehealth: Payer: Self-pay

## 2023-10-02 NOTE — Telephone Encounter (Signed)
 Spoke with patient. Advised of need for visit (suggested video) unless patient desires in person visit. Transferred to front desk for scheduling.

## 2023-10-02 NOTE — Telephone Encounter (Signed)
 TRIAGE VOICEMAIL: Patient calling to discuss getting HRT.

## 2023-10-16 ENCOUNTER — Ambulatory Visit (INDEPENDENT_AMBULATORY_CARE_PROVIDER_SITE_OTHER): Admitting: Certified Nurse Midwife

## 2023-10-16 ENCOUNTER — Encounter: Payer: Self-pay | Admitting: Certified Nurse Midwife

## 2023-10-16 VITALS — BP 134/94 | HR 74 | Ht 66.0 in | Wt 170.0 lb

## 2023-10-16 DIAGNOSIS — N951 Menopausal and female climacteric states: Secondary | ICD-10-CM | POA: Diagnosis not present

## 2023-10-16 MED ORDER — ESTRADIOL-NORETHINDRONE ACET 1-0.5 MG PO TABS: 1.0000 | ORAL_TABLET | Freq: Every day | ORAL | 3 refills | Status: AC

## 2023-10-16 NOTE — Progress Notes (Signed)
 GYN ENCOUNTER NOTE  Subjective:       Jessica Henson is a 58 y.o. G0P0000 female is here for gynecologic evaluation of the following issues:  1. Pt c/o insomnia due to night sweats and flushing, weight gain, hair loss/thining, and fatigue. She is interested in HRT  .     Gynecologic History No LMP recorded. Patient is premenopausal. Contraception: post menopausal status Last Pap: 06/07/2022. Results were: normal Last mammogram: 08/18/2023. Results were: normal  Obstetric History OB History  Gravida Para Term Preterm AB Living  0 0 0 0 0 0  SAB IAB Ectopic Multiple Live Births  0 0 0 0 0    Past Medical History:  Diagnosis Date   History of mammogram 11/06/2015   Benign BIRADS 2   Obesity (BMI 30.0-34.9)    Situational depression 2015    Past Surgical History:  Procedure Laterality Date   TONSILLECTOMY  1982    Current Outpatient Medications on File Prior to Visit  Medication Sig Dispense Refill   Biotin 1 MG CAPS Take 1 capsule by mouth daily.     Calcium 500-100 MG-UNIT CHEW Chew 1 Piece by mouth 2 (two) times daily as needed.     Multiple Vitamins-Minerals (MULTIVITAMIN WITH MINERALS) tablet Take 1 tablet by mouth daily.     naproxen  (NAPROSYN ) 500 MG tablet Take 1 tablet (500 mg total) by mouth 2 (two) times daily with a meal. 30 tablet 0   cyclobenzaprine  (FLEXERIL ) 10 MG tablet Take 1 tablet (10 mg total) by mouth 3 (three) times daily as needed for muscle spasms. (Patient not taking: Reported on 10/16/2023) 30 tablet 0   levonorgestrel -ethinyl estradiol  (VIENVA) 0.1-20 MG-MCG tablet Take 1 tablet by mouth daily. (Patient not taking: Reported on 10/16/2023) 84 tablet 3   No current facility-administered medications on file prior to visit.    No Known Allergies  Social History   Socioeconomic History   Marital status: Married    Spouse name: Not on file   Number of children: 0   Years of education: Not on file   Highest education level: Not on file  Occupational  History   Occupation: Civil [employer] fast streamer  Tobacco Use   Smoking status: Never   Smokeless tobacco: Never  Vaping Use   Vaping status: Never Used  Substance and Sexual Activity   Alcohol use: Yes    Comment: twice a month   Drug use: No   Sexual activity: Yes    Partners: Male    Birth control/protection: Pill  Other Topics Concern   Not on file  Social History Narrative   Not on file   Social Drivers of Health   Financial Resource Strain: Not on file  Food Insecurity: Not on file  Transportation Needs: Not on file  Physical Activity: Sufficiently Active (03/05/2018)   Exercise Vital Sign    Days of Exercise per Week: 3 days    Minutes of Exercise per Session: 60 min  Stress: Stress Concern Present (03/05/2018)   [employer] of Occupational Health - Occupational Stress Questionnaire    Feeling of Stress : To some extent  Social Connections: Somewhat Isolated (03/05/2018)   Social Connection and Isolation Panel    Frequency of Communication with Friends and Family: Twice a week    Frequency of Social Gatherings with Friends and Family: Once a week    Attends Religious Services: Never    Database administrator or Organizations: No    Attends Banker Meetings: Never  Marital Status: Married  Catering manager Violence: Not At Risk (03/05/2018)   Humiliation, Afraid, Rape, and Kick questionnaire    Fear of Current or Ex-Partner: No    Emotionally Abused: No    Physically Abused: No    Sexually Abused: No    Family History  Problem Relation Age of Onset   Colon cancer Mother 42   Lung cancer Mother 43   Lung cancer Father 77   Lung cancer Maternal Aunt    Breast cancer Neg Hx     The following portions of the patient's history were reviewed and updated as appropriate: allergies, current medications, past family history, past medical history, past social history, past surgical history and problem list.  Review of Systems Review of Systems - Negative except as  mentioned in HPI Review of Systems - General ROS: negative for - chills, fever, malaise . Positive for fatigue,  night sweats, and insomnia Hematological and Lymphatic ROS: negative for - bleeding problems or swollen lymph nodes Gastrointestinal ROS: negative for - abdominal pain, blood in stools, change in bowel habits and nausea/vomiting Musculoskeletal ROS: negative for - joint pain, muscle pain or muscular weakness Genito-Urinary ROS: negative for - change in menstrual cycle, dysmenorrhea, dyspareunia, dysuria, genital discharge, genital ulcers, hematuria, incontinence, irregular/heavy menses, nocturia or pelvic pain.   Objective:   BP (!) 134/94   Pulse 74   Ht 5' 6 (1.676 m)   Wt 170 lb (77.1 kg)   BMI 27.44 kg/m  CONSTITUTIONAL: Well-developed, well-nourished female in no acute distress.  HENT:  Normocephalic, atraumatic.  NECK: Normal range of motion, supple, no masses.  Normal thyroid .  SKIN: Skin is warm and dry. No rash noted. Not diaphoretic. No erythema. No pallor. NEUROLGIC: Alert and oriented to person, place, and time. PSYCHIATRIC: Normal mood and affect. Normal behavior. Normal judgment and thought content. CARDIOVASCULAR:Not Examined RESPIRATORY: Not Examined BREASTS: Not Examined ABDOMEN: Soft, non distended; Non tender.  No Organomegaly. PELVIC:not indicated not due  MUSCULOSKELETAL: Normal range of motion. No tenderness.  No cyanosis, clubbing, or edema.     Assessment:  Hot flashes Hair thinning Insomnia Weight gain   Plan:   Reviewed menopausal symptoms and self help measures. Pt state she has tried a few natural /herbal remedies that have not helped. She has tried melatonin to help with sleep. Reviewed HRT options . She notes she has no contraindications for HRT however, recently she has had a few elevated BP readings. Recommend returning in 2 weeks for BP check. She is in agreement. She state she had been on the pill for several years , so she is  interested in the combined oral HRT. Orders placed for [facility].   Zelda Hummer, CNM

## 2023-10-16 NOTE — Patient Instructions (Signed)
 Menopause: What to Know Menopause is the time in your life when your menstrual periods stop. It marks the end of your ability to get pregnant. It can be defined as not having a period for 12 months without another medical cause. The time when you start to move into menopause is called perimenopause. It often happens between ages 67-55. It can last for many years. During perimenopause, hormone levels change in your body. This can cause symptoms and affect your health. Menopause may make you more likely to have: Bones that are weak and break more easily. Depression. This is when you feel sad or hopeless. Arteries that harden and get narrow. These can cause heart attacks and strokes. What are the causes? In most cases, menopause is a natural change to your body and hormone levels that happens as you get older. But in some cases, it may be caused by changes that aren't natural. These include: Surgery to take out both ovaries. Side effects from some medicines. What increases the risk? You're more likely to go through menopause early if: You have an abnormal growth (tumor) of the pituitary gland in your brain. You have a disease that affects your ovaries. You've had certain treatments for cancer. These include: Chemotherapy. Hormone therapy. Radiation therapy on the area between your hips (pelvis). You smoke a lot or drink a lot of alcohol. Other people in your family have gone through menopause early. You're very thin. What are the signs or symptoms? You may have: Hot flashes. Irregular periods. Night sweats. Changes in how you feel about sex. You may: Have less of a sex drive. Feel more discomfort around your sexuality. Vaginal dryness and thinning of the vaginal walls. This may make it hurt to have sex. Skin changes, such as: Dry skin. New wrinkles. Headaches. Other symptoms may include: Trouble sleeping. Mood swings. Memory problems. Weight gain. Hair growth on your face and  chest. Bladder infections or trouble peeing. How is this diagnosed? You may be diagnosed based on: Your medical history. An exam. Your age. Your history of menstrual periods. Your symptoms. Hormone tests. How is this treated? In some cases, no treatment is needed. Talk with your health care provider about if you should get treated. Treatments may include: Menopausal hormone therapy (MHT). Medicines to treat certain symptoms. Acupuncture. Vitamin or herbal supplements. Before you start treatment, let your provider know if you or anyone in your family has or has had: Heart disease. Breast cancer. Blood clots. Diabetes. Osteoporosis. Follow these instructions at home: Eating and drinking  Eat a balanced diet. It should include: Fresh fruits and vegetables. Whole grains. Lean protein. Low-fat dairy. Eat lots of foods that have calcium and vitamin D in them. These can help keep your bones healthy. Foods and drinks that are rich in calcium include: Yogurt and low-fat milk. Beans. Almonds. Sardines. Broccoli and kale. To help prevent hot flashes, stay away from: Alcohol. Drinks with caffeine in them. Spicy foods. Lifestyle Do not smoke, vape, or use nicotine or tobacco. Get 7-8 hours of sleep each night. If you have hot flashes, you may want to: Dress in layers. Avoid things that may trigger hot flashes, like warm places or stress. Take slow, deep breaths when a hot flash starts. Keep a fan in your home and office. Find ways to manage stress. You may want to try: Deep breathing. Meditation. Writing in a journal. Ask your provider about going to group therapy. Therapy can help you get support from others who are going  through menopause. General instructions  Talk with your provider before you take any herbal supplements. Keep track of your symptoms. Track: When they start. How often you have them. How long they last. Use vaginal lubricants or moisturizers. These  can help with: Vaginal dryness. Comfort during sex. Contact a health care provider if: You're older than 55 and still get periods. You have pain during sex. You haven't had a period for 12 months and then start to bleed from your vagina. It hurts to pee. You get very bad headaches. Get help right away if: You're very depressed. You have a lot of bleeding from your vagina. Your heart is beating too fast. You have very bad belly pain or indigestion that doesn't go away with medicines. This information is not intended to replace advice given to you by your health care provider. Make sure you discuss any questions you have with your health care provider. Document Revised: 09/19/2022 Document Reviewed: 09/19/2022 Elsevier Patient Education  2024 [employer].

## 2023-10-31 ENCOUNTER — Encounter: Payer: Self-pay | Admitting: Certified Nurse Midwife

## 2023-10-31 ENCOUNTER — Ambulatory Visit (INDEPENDENT_AMBULATORY_CARE_PROVIDER_SITE_OTHER): Admitting: Certified Nurse Midwife

## 2023-10-31 VITALS — BP 112/75 | HR 71 | Resp 16 | Ht 66.0 in | Wt 170.8 lb

## 2023-10-31 DIAGNOSIS — Z79899 Other long term (current) drug therapy: Secondary | ICD-10-CM

## 2023-10-31 NOTE — Progress Notes (Signed)
 Error

## 2023-10-31 NOTE — Patient Instructions (Signed)
 Perimenopause: What to Know Perimenopause is the time in your life when your levels of estrogen start to go down. Estrogen is the female hormone made by your ovaries. Perimenopause can start 2-8 years before menopause. It can cause changes to your menstrual period. During this time, your ovaries may or may not make an egg. In many cases, you can still get pregnant. What are the causes? Perimenopause is a natural change in your homone levels that happens as you get older. What increases the risk? You're more likely to start perimenopause early if: You have an abnormal growth (tumor) of the pituitary gland in your brain. You have a disease that affects your ovaries. You've had certain treatments for cancer. These include: Chemotherapy. Hormone therapy. Radiation therapy on the area between your hips (pelvis). You smoke a lot or drink a lot of alcohol. Other family members have gone through menopause early. What are the signs or symptoms? Symptoms are unique to each person. You may have: Hot flashes. Irregular periods. Night sweats. Changes in how you feel about sex. You may have less of a sex drive or feel more discomfort around your sexuality. Vaginal dryness. Headaches. Mood swings. Other symptoms may include: Depression. This is when you feel sad or hopeless. Trouble sleeping. Memory problems or trouble focusing. Irritability. This means getting annoyed easily. Tiredness. Weight gain. Anxiety. This is feeling worried or nervous. You can also have trouble getting pregnant. How is this diagnosed? You may be diagnosed based on: Your medical history. An exam. Your age. Your history of menstrual periods. Your symptoms. Hormone tests. How is this treated? In some cases, no treatment is needed. Talk with your health care provider about if you should get treated. Treatments may include: Menopausal hormone therapy (MHT). Medicines to treat certain  symptoms. Acupuncture. Vitamin or herbal supplements. Before you start treatment, let your provider know if you or anyone in your family has or has had: Heart disease. Breast cancer. Blood clots. Diabetes. Osteoporosis. Follow these instructions at home: Eating and drinking  Eat a balanced diet. It should include: Fresh fruits and vegetables. Whole grains. Soybeans. Eggs. Lean meat. Low-fat dairy. To help prevent hot flashes, stay away from: Alcohol. Drinks with caffeine in them. Spicy foods. Lifestyle Do not smoke, vape, or use nicotine or tobacco. Get at least 30 minutes of physical activity on 5 or more days each week. Get 7-8 hours of sleep each night. Dress in layers that can be taken off if you have a hot flash. Find ways to manage stress. You may want to try: Deep breathing. Meditation. Writing in a journal. General instructions  Take your medicines only as told. Keep track of your periods. Track: When they happen. How heavy they are. How long they last. How much time passes between periods. Keep track of your symptoms. Track: When they start. How often you have them. How long they last. Use vaginal lubricants or moisturizers. These can help with: Vaginal dryness. Comfort during sex. You can still get pregnant if you're having any periods. Make sure you use birth control if you don't want to get pregnant. Contact a health care provider if: You have a very heavy period or pass blood clots. Your period lasts more than 2 days longer than normal. Your period comes back sooner than 21 days. You bleed after having sex. You have pain during sex. You have pain when you pee. You get very bad headaches. You have trouble with your eyesight. Get help right away if: You  have chest pain. You have trouble breathing. You have trouble talking. You have very bad depression. This information is not intended to replace advice given to you by your health care provider.  Make sure you discuss any questions you have with your health care provider. Document Revised: 09/19/2022 Document Reviewed: 09/19/2022 Elsevier Patient Education  2024 [employer].

## 2023-10-31 NOTE — Progress Notes (Signed)
  Medication Management Clinic Visit Note  Patient: Jessica Henson MRN: [id number] Date of Birth: 06-Apr-1965 PCP: Lynda Bradley, CNM   Aslyn Rhyne 58 y.o. female presents for a follow up visit . She was seen on 10/16/23 on HRT for symptom management and had elevated BP at that appointment.   BP 112/75   Pulse 71   Resp 16   Ht 5' 6 (1.676 m)   Wt 170 lb 12.8 oz (77.5 kg)   BMI 27.57 kg/m   Patient Information   Past Medical History:  Diagnosis Date   History of mammogram 11/06/2015   Benign BIRADS 2   Obesity (BMI 30.0-34.9)    Situational depression 2015      Past Surgical History:  Procedure Laterality Date   TONSILLECTOMY  1982     Family History  Problem Relation Age of Onset   Colon cancer Mother 46   Lung cancer Mother 37   Lung cancer Father 54   Lung cancer Maternal Aunt    Breast cancer Neg Hx     New Diagnoses (since last visit):         Social History   Substance and Sexual Activity  Alcohol Use Yes   Comment: twice a month      Social History   Tobacco Use  Smoking Status Never  Smokeless Tobacco Never      Health Maintenance  Topic Date Due   HIV Screening  Never done   Hepatitis C Screening  Never done   DTaP/Tdap/Td (1 - Tdap) Never done   Hepatitis B Vaccines 19-59 Average Risk (1 of 3 - 19+ 3-dose series) Never done   Colonoscopy  Never done   Pneumococcal Vaccine: 50+ Years (1 of 1 - PCV) Never done   Zoster Vaccines- Shingrix (1 of 2) Never done   Influenza Vaccine  Never done   COVID-19 Vaccine (1 - 2024-25 season) Never done   Mammogram  08/06/2025   Cervical Cancer Screening (HPV/Pap Cotest)  06/07/2027   HPV VACCINES  Aged Out   Meningococcal B Vaccine  Aged Out     Assessment and Plan:  Pt BP WNL today. She state she has only been on the medication x 1 week but has not noticed a difference in her symptoms . Discussed follow up in 3 months should her symptoms not improve. We discussed natural self help measures for  symptom management. She is in agreement.   Face to face time 10 min.   Zelda Hummer, CNM

## 2024-01-06 ENCOUNTER — Encounter: Payer: Self-pay | Admitting: Certified Nurse Midwife

## 2024-02-06 NOTE — Telephone Encounter (Signed)
 Spoke to patient via phone.  Advised she should make an appointment to be seen.  She will call back when she is not at work to get on the schedule.
